# Patient Record
Sex: Male | Born: 1945 | Race: White | Hispanic: No | Marital: Married | Smoking: Never smoker
Health system: Southern US, Community
[De-identification: ages and names within clinical notes are randomized; demographics above are authoritative.]

## PROBLEM LIST (undated history)

## (undated) DIAGNOSIS — I10 Essential (primary) hypertension: Secondary | ICD-10-CM

## (undated) DIAGNOSIS — K760 Fatty (change of) liver, not elsewhere classified: Secondary | ICD-10-CM

## (undated) DIAGNOSIS — K225 Diverticulum of esophagus, acquired: Secondary | ICD-10-CM

## (undated) DIAGNOSIS — K709 Alcoholic liver disease, unspecified: Secondary | ICD-10-CM

## (undated) DIAGNOSIS — T7840XA Allergy, unspecified, initial encounter: Secondary | ICD-10-CM

## (undated) DIAGNOSIS — I7121 Aneurysm of the ascending aorta, without rupture: Secondary | ICD-10-CM

## (undated) DIAGNOSIS — R053 Chronic cough: Secondary | ICD-10-CM

## (undated) DIAGNOSIS — E785 Hyperlipidemia, unspecified: Secondary | ICD-10-CM

## (undated) DIAGNOSIS — R410 Disorientation, unspecified: Secondary | ICD-10-CM

## (undated) DIAGNOSIS — M1990 Unspecified osteoarthritis, unspecified site: Secondary | ICD-10-CM

## (undated) DIAGNOSIS — I712 Thoracic aortic aneurysm, without rupture: Secondary | ICD-10-CM

## (undated) DIAGNOSIS — K7030 Alcoholic cirrhosis of liver without ascites: Secondary | ICD-10-CM

## (undated) HISTORY — PX: POLYPECTOMY: SHX149

## (undated) HISTORY — PX: HERNIA REPAIR: SHX51

## (undated) HISTORY — DX: Chronic cough: R05.3

## (undated) HISTORY — PX: COLONOSCOPY: SHX174

## (undated) HISTORY — DX: Thoracic aortic aneurysm, without rupture: I71.2

## (undated) HISTORY — DX: Fatty (change of) liver, not elsewhere classified: K76.0

## (undated) HISTORY — DX: Unspecified osteoarthritis, unspecified site: M19.90

## (undated) HISTORY — DX: Alcoholic liver disease, unspecified: K70.9

## (undated) HISTORY — DX: Hyperlipidemia, unspecified: E78.5

## (undated) HISTORY — DX: Essential (primary) hypertension: I10

## (undated) HISTORY — DX: Diverticulum of esophagus, acquired: K22.5

## (undated) HISTORY — DX: Allergy, unspecified, initial encounter: T78.40XA

## (undated) HISTORY — PX: TONSILLECTOMY: SUR1361

## (undated) HISTORY — DX: Alcoholic cirrhosis of liver without ascites: K70.30

## (undated) HISTORY — DX: Disorientation, unspecified: R41.0

## (undated) HISTORY — DX: Aneurysm of the ascending aorta, without rupture: I71.21

---

## 1988-05-19 HISTORY — PX: HERNIA REPAIR: SHX51

## 2006-12-30 DIAGNOSIS — I10 Essential (primary) hypertension: Secondary | ICD-10-CM | POA: Insufficient documentation

## 2014-03-20 DIAGNOSIS — E785 Hyperlipidemia, unspecified: Secondary | ICD-10-CM | POA: Insufficient documentation

## 2016-04-23 ENCOUNTER — Telehealth: Payer: Self-pay | Admitting: Internal Medicine

## 2016-04-28 ENCOUNTER — Encounter: Payer: Self-pay | Admitting: Internal Medicine

## 2016-06-02 NOTE — Telephone Encounter (Signed)
Done

## 2016-06-12 ENCOUNTER — Ambulatory Visit (AMBULATORY_SURGERY_CENTER): Payer: Self-pay

## 2016-06-12 VITALS — Ht 72.0 in | Wt 175.0 lb

## 2016-06-12 DIAGNOSIS — Z860100 Personal history of colon polyps, unspecified: Secondary | ICD-10-CM

## 2016-06-12 NOTE — Progress Notes (Signed)
No allergies to eggs or soy No past problems with anesthesia No diet meds No home oxygen  Declined emmi 

## 2016-06-13 ENCOUNTER — Encounter: Payer: Self-pay | Admitting: Internal Medicine

## 2016-06-27 ENCOUNTER — Ambulatory Visit (AMBULATORY_SURGERY_CENTER): Payer: Medicare PPO | Admitting: Internal Medicine

## 2016-06-27 ENCOUNTER — Encounter: Payer: Self-pay | Admitting: Internal Medicine

## 2016-06-27 VITALS — BP 99/69 | HR 63 | Temp 97.8°F | Resp 9 | Ht 72.0 in | Wt 175.0 lb

## 2016-06-27 DIAGNOSIS — D120 Benign neoplasm of cecum: Secondary | ICD-10-CM

## 2016-06-27 DIAGNOSIS — Z860100 Personal history of colon polyps, unspecified: Secondary | ICD-10-CM

## 2016-06-27 DIAGNOSIS — D123 Benign neoplasm of transverse colon: Secondary | ICD-10-CM

## 2016-06-27 HISTORY — DX: Personal history of colonic polyps: Z86.010

## 2016-06-27 MED ORDER — SODIUM CHLORIDE 0.9 % IV SOLN: 500.0000 mL | INTRAVENOUS | Status: DC

## 2016-06-27 NOTE — Patient Instructions (Addendum)
   I found and removed 7 tiny polyps.  I will let you know pathology results and when to have another routine colonoscopy by mail.  I appreciate the opportunity to care for you. Gatha Mayer, MD, FACG   YOU HAD AN ENDOSCOPIC PROCEDURE TODAY AT [facility]:   Refer to the procedure report that was given to you for any specific questions about what was found during the examination.  If the procedure report does not answer your questions, please call your gastroenterologist to clarify.  If you requested that your care partner not be given the details of your procedure findings, then the procedure report has been included in a sealed envelope for you to review at your convenience later.  YOU SHOULD EXPECT: Some feelings of bloating in the abdomen. Passage of more gas than usual.  Walking can help get rid of the air that was put into your GI tract during the procedure and reduce the bloating. If you had a lower endoscopy (such as a colonoscopy or flexible sigmoidoscopy) you may notice spotting of blood in your stool or on the toilet paper. If you underwent a bowel prep for your procedure, you may not have a normal bowel movement for a few days.  Please Note:  You might notice some irritation and congestion in your nose or some drainage.  This is from the oxygen used during your procedure.  There is no need for concern and it should clear up in a day or so.  SYMPTOMS TO REPORT IMMEDIATELY:   Following lower endoscopy (colonoscopy or flexible sigmoidoscopy):  Excessive amounts of blood in the stool  Significant tenderness or worsening of abdominal pains  Swelling of the abdomen that is new, acute  Fever of 100F or higher    For urgent or emergent issues, a gastroenterologist can be reached at any hour by calling [phone].   DIET:  We do recommend a small meal at first, but then you may proceed to your regular diet.  Drink plenty of fluids but you should avoid  alcoholic beverages for 24 hours.  ACTIVITY:  You should plan to take it easy for the rest of today and you should NOT DRIVE or use heavy machinery until tomorrow (because of the sedation medicines used during the test).    FOLLOW UP: Our staff will call the number listed on your records the next business day following your procedure to check on you and address any questions or concerns that you may have regarding the information given to you following your procedure. If we do not reach you, we will leave a message.  However, if you are feeling well and you are not experiencing any problems, there is no need to return our call.  We will assume that you have returned to your regular daily activities without incident.  If any biopsies were taken you will be contacted by phone or by letter within the next 1-3 weeks.  Please call us at [phone] if you have not heard about the biopsies in 3 weeks.    SIGNATURES/CONFIDENTIALITY: You and/or your care partner have signed paperwork which will be entered into your electronic medical record.  These signatures attest to the fact that that the information above on your After Visit Summary has been reviewed and is understood.  Full responsibility of the confidentiality of this discharge information lies with you and/or your care-partner.   INFORMATION ON POLYPS GIVEN TO YOU TODAY

## 2016-06-27 NOTE — Progress Notes (Signed)
Called to room to assist during endoscopic procedure.  Patient ID and intended procedure confirmed with present staff. Received instructions for my participation in the procedure from the performing physician.  

## 2016-06-27 NOTE — Progress Notes (Signed)
Report to PACU, RN, vss, BBS= Clear.  

## 2016-06-27 NOTE — Op Note (Addendum)
[facility] Patient Name: Ryan Moreno Procedure Date: 06/27/2016 8:05 AM MRN: [id number] Endoscopist: Gatha Mayer , MD Age: 71 Referring MD:  Date of Birth: Jul 19, 1945 Gender: Male Account #: [id number] Procedure:                Colonoscopy Indications:              Surveillance: Personal history of adenomatous                            polyps on last colonoscopy 5 years ago Medicines:                Propofol per Anesthesia, Monitored Anesthesia Care Procedure:                Pre-Anesthesia Assessment:                           - Prior to the procedure, a History and Physical                            was performed, and patient medications and                            allergies were reviewed. The patient's tolerance of                            previous anesthesia was also reviewed. The risks                            and benefits of the procedure and the sedation                            options and risks were discussed with the patient.                            All questions were answered, and informed consent                            was obtained. Prior Anticoagulants: The patient                            last took previous NSAID medication 1 day prior to                            the procedure. ASA Grade Assessment: II - A patient                            with mild systemic disease. After reviewing the                            risks and benefits, the patient was deemed in                            satisfactory condition to undergo the procedure.  After obtaining informed consent, the colonoscope                            was passed under direct vision. Throughout the                            procedure, the patient's blood pressure, pulse, and                            oxygen saturations were monitored continuously. The                            Model CF-HQ190L [id number]) scope was introduced      through the anus and advanced to the the cecum,                            identified by appendiceal orifice and ileocecal                            valve. The colonoscopy was performed without                            difficulty. The patient tolerated the procedure                            well. The quality of the bowel preparation was                            good. The bowel preparation used was Miralax. The                            ileocecal valve, appendiceal orifice, and rectum                            were photographed. Scope In: 8:10:22 AM Scope Out: 8:30:29 AM Scope Withdrawal Time: 0 hours 16 minutes 5 seconds  Total Procedure Duration: 0 hours 20 minutes 7 seconds  Findings:                 The perianal and digital rectal examinations were                            normal. Pertinent negatives include normal prostate                            (size, shape, and consistency).                           Seven sessile polyps were found in the transverse                            colon and cecum. The polyps were diminutive in                            size. These polyps  were removed with a cold snare.                            Resection and retrieval were complete. Verification                            of patient identification for the specimen was                            done. Estimated blood loss was minimal.                           The exam was otherwise without abnormality on                            direct and retroflexion views. Complications:            No immediate complications. Estimated Blood Loss:     Estimated blood loss was minimal. Impression:               - Seven diminutive polyps in the transverse colon                            and in the cecum, removed with a cold snare.                            Resected and retrieved.                           - The examination was otherwise normal on direct                            and  retroflexion views. Recommendation:           - Patient has a contact number available for                            emergencies. The signs and symptoms of potential                            delayed complications were discussed with the                            patient. Return to normal activities tomorrow.                            Written discharge instructions were provided to the                            patient.                           - Resume previous diet.                           - Continue present medications.                           -  Repeat colonoscopy is recommended for                            surveillance. The colonoscopy date will be                            determined after pathology results from today's                            exam become available for review. Gatha Mayer, MD 06/27/2016 8:40:25 AM This report has been signed electronically.

## 2016-06-30 ENCOUNTER — Telehealth: Payer: Self-pay | Admitting: *Deleted

## 2016-06-30 ENCOUNTER — Telehealth: Payer: Self-pay

## 2016-06-30 NOTE — Telephone Encounter (Signed)
  Follow up Call-  Call back number 06/27/2016  Post procedure Call Back phone  # [phone]  Permission to leave phone message Yes     Patient questions:  Do you have a fever, pain , or abdominal swelling? No. Pain Score  0 *  Have you tolerated food without any problems? Yes.    Have you been able to return to your normal activities? Yes.    Do you have any questions about your discharge instructions: Diet   No. Medications  No. Follow up visit  No.  Do you have questions or concerns about your Care? No.  Actions: * If pain score is 4 or above: No action needed, pain <4.

## 2016-07-02 ENCOUNTER — Encounter: Payer: Self-pay | Admitting: Internal Medicine

## 2016-07-02 DIAGNOSIS — Z86010 Personal history of colonic polyps: Secondary | ICD-10-CM

## 2016-07-02 NOTE — Progress Notes (Signed)
7 diminutive adenomas Recall 2021

## 2016-09-18 NOTE — Telephone Encounter (Signed)
  Follow up Call-  Call back number 06/27/2016  Post procedure Call Back phone  # [phone]  Permission to leave phone message Yes  Some recent data might be hidden     Patient questions:  Do you have a fever, pain , or abdominal swelling? No. Pain Score  0 *  Have you tolerated food without any problems? Yes.    Have you been able to return to your normal activities? Yes.    Do you have any questions about your discharge instructions: Diet   No. Medications  No. Follow up visit  No.  Do you have questions or concerns about your Care? No.  Actions: * If pain score is 4 or above: No action needed, pain <4.

## 2019-07-20 DIAGNOSIS — I10 Essential (primary) hypertension: Secondary | ICD-10-CM | POA: Diagnosis not present

## 2019-08-24 ENCOUNTER — Telehealth: Payer: Self-pay | Admitting: Internal Medicine

## 2019-08-24 NOTE — Telephone Encounter (Signed)
I reviewed the letter from 2018.  He has agreed to schedule the colon and pre-visit.

## 2019-08-24 NOTE — Telephone Encounter (Signed)
Patient called states he received a recall letter that he's due 06/2019 however he is saying that is incorrect. Please confirm when the patient is due for his colonoscopy

## 2019-09-05 ENCOUNTER — Ambulatory Visit (AMBULATORY_SURGERY_CENTER): Payer: Self-pay | Admitting: *Deleted

## 2019-09-05 ENCOUNTER — Other Ambulatory Visit: Payer: Self-pay

## 2019-09-05 VITALS — Temp 97.8°F | Ht 71.0 in | Wt 188.0 lb

## 2019-09-05 DIAGNOSIS — Z86010 Personal history of colonic polyps: Secondary | ICD-10-CM

## 2019-09-05 NOTE — Progress Notes (Signed)
Patient is here in-person for PV. Patient denies any allergies to eggs or soy. Patient denies any problems with anesthesia/sedation. Patient denies any oxygen use at home. Patient denies taking any diet/weight loss medications or blood thinners. Patient is not being treated for MRSA or C-diff. Patient is aware of our care-partner policy and 0000000 safety protocol. Covid vaccines completed on 08/17/2019.

## 2019-10-05 ENCOUNTER — Encounter: Payer: Self-pay | Admitting: Internal Medicine

## 2019-10-05 ENCOUNTER — Other Ambulatory Visit: Payer: Self-pay

## 2019-10-05 ENCOUNTER — Ambulatory Visit (AMBULATORY_SURGERY_CENTER): Payer: Medicare Other | Admitting: Internal Medicine

## 2019-10-05 VITALS — BP 125/77 | HR 65 | Temp 96.8°F | Resp 12 | Ht 71.0 in | Wt 188.0 lb

## 2019-10-05 DIAGNOSIS — Z86010 Personal history of colonic polyps: Secondary | ICD-10-CM | POA: Diagnosis not present

## 2019-10-05 DIAGNOSIS — D125 Benign neoplasm of sigmoid colon: Secondary | ICD-10-CM | POA: Diagnosis not present

## 2019-10-05 DIAGNOSIS — D124 Benign neoplasm of descending colon: Secondary | ICD-10-CM

## 2019-10-05 MED ORDER — SODIUM CHLORIDE 0.9 % IV SOLN: 500.0000 mL | INTRAVENOUS | Status: DC

## 2019-10-05 NOTE — Progress Notes (Signed)
Temp JB V/s CW I have reviewed the patient's medical history in detail and updated the computerized patient record. 

## 2019-10-05 NOTE — Progress Notes (Signed)
A/ox3, pleased with MAC, report to RN 

## 2019-10-05 NOTE — Progress Notes (Signed)
Called to room to assist during endoscopic procedure.  Patient ID and intended procedure confirmed with present staff. Received instructions for my participation in the procedure from the performing physician.  

## 2019-10-05 NOTE — Patient Instructions (Addendum)
Only two tiny polyps today.  I am not going to recommend a routine repeat exam.  I appreciate the opportunity to care for you. Gatha Mayer, MD, FACG YOU HAD AN ENDOSCOPIC PROCEDURE TODAY AT [facility]:   Refer to the procedure report that was given to you for any specific questions about what was found during the examination.  If the procedure report does not answer your questions, please call your gastroenterologist to clarify.  If you requested that your care partner not be given the details of your procedure findings, then the procedure report has been included in a sealed envelope for you to review at your convenience later.  YOU SHOULD EXPECT: Some feelings of bloating in the abdomen. Passage of more gas than usual.  Walking can help get rid of the air that was put into your GI tract during the procedure and reduce the bloating. If you had a lower endoscopy (such as a colonoscopy or flexible sigmoidoscopy) you may notice spotting of blood in your stool or on the toilet paper. If you underwent a bowel prep for your procedure, you may not have a normal bowel movement for a few days.  Please Note:  You might notice some irritation and congestion in your nose or some drainage.  This is from the oxygen used during your procedure.  There is no need for concern and it should clear up in a day or so.  SYMPTOMS TO REPORT IMMEDIATELY:   Following lower endoscopy (colonoscopy or flexible sigmoidoscopy):  Excessive amounts of blood in the stool  Significant tenderness or worsening of abdominal pains  Swelling of the abdomen that is new, acute  Fever of 100F or higher   For urgent or emergent issues, a gastroenterologist can be reached at any hour by calling [phone]. Do not use MyChart messaging for urgent concerns.    DIET:  We do recommend a small meal at first, but then you may proceed to your regular diet.  Drink plenty of fluids but you should avoid alcoholic  beverages for 24 hours.  MEDICATIONS: Continue present medications.  Please see handouts given to you by your recovery nurse.  ACTIVITY:  You should plan to take it easy for the rest of today and you should NOT DRIVE or use heavy machinery until tomorrow (because of the sedation medicines used during the test).    FOLLOW UP: Our staff will call the number listed on your records 48-72 hours following your procedure to check on you and address any questions or concerns that you may have regarding the information given to you following your procedure. If we do not reach you, we will leave a message.  We will attempt to reach you two times.  During this call, we will ask if you have developed any symptoms of COVID 19. If you develop any symptoms (ie: fever, flu-like symptoms, shortness of breath, cough etc.) before then, please call [phone].  If you test positive for Covid 19 in the 2 weeks post procedure, please call and report this information to [place].    If any biopsies were taken you will be contacted by phone or by letter within the next 1-3 weeks.  Please call us at [phone] if you have not heard about the biopsies in 3 weeks.   Thank you for allowing [place] to provide for your healthcare needs today.  SIGNATURES/CONFIDENTIALITY: You and/or your care partner have signed paperwork which will be entered into your electronic medical  record.  These signatures attest to the fact that that the information above on your After Visit Summary has been reviewed and is understood.  Full responsibility of the confidentiality of this discharge information lies with you and/or your care-partner. 

## 2019-10-05 NOTE — Op Note (Signed)
[facility] Patient Name: Ryan Moreno Procedure Date: 10/05/2019 10:25 AM MRN: [id number] Endoscopist: Gatha Mayer , MD Age: 74 Referring MD:  Date of Birth: 11-14-1945 Gender: Male Account #: [id number] Procedure:                Colonoscopy Indications:              Surveillance: Personal history of adenomatous                            polyps on last colonoscopy 3 years ago Medicines:                Propofol per Anesthesia, Monitored Anesthesia Care Procedure:                Pre-Anesthesia Assessment:                           - Prior to the procedure, a History and Physical                            was performed, and patient medications and                            allergies were reviewed. The patient's tolerance of                            previous anesthesia was also reviewed. The risks                            and benefits of the procedure and the sedation                            options and risks were discussed with the patient.                            All questions were answered, and informed consent                            was obtained. Prior Anticoagulants: The patient has                            taken no previous anticoagulant or antiplatelet                            agents. ASA Grade Assessment: II - A patient with                            mild systemic disease. After reviewing the risks                            and benefits, the patient was deemed in                            satisfactory condition to undergo the procedure.  After obtaining informed consent, the colonoscope                            was passed under direct vision. Throughout the                            procedure, the patient's blood pressure, pulse, and                            oxygen saturations were monitored continuously. The                            Colonoscope was introduced through the anus and                             advanced to the the cecum, identified by                            appendiceal orifice and ileocecal valve. The                            colonoscopy was performed without difficulty. The                            patient tolerated the procedure well. The quality                            of the bowel preparation was good. The bowel                            preparation used was Miralax via split dose                            instruction. Anatomical landmarks were photographed. Scope In: 10:34:11 AM Scope Out: 10:52:58 AM Scope Withdrawal Time: 0 hours 14 minutes 2 seconds  Total Procedure Duration: 0 hours 18 minutes 47 seconds  Findings:                 The perianal and digital rectal examinations were                            normal. Pertinent negatives include normal prostate                            (size, shape, and consistency).                           Two sessile polyps were found in the sigmoid colon                            and descending colon. The polyps were 1 to 2 mm in                            size. These polyps were removed with a  cold biopsy                            forceps. Resection and retrieval were complete.                            Verification of patient identification for the                            specimen was done. Estimated blood loss was minimal.                           Scattered small-mouthed diverticula were found in                            the sigmoid colon and descending colon.                           The exam was otherwise without abnormality on                            direct and retroflexion views. Complications:            No immediate complications. Estimated Blood Loss:     Estimated blood loss was minimal. Impression:               - Two 1 to 2 mm polyps in the sigmoid colon and in                            the descending colon, removed with a cold biopsy                            forceps. Resected and  retrieved.                           - Diverticulosis in the sigmoid colon and in the                            descending colon.                           - The examination was otherwise normal on direct                            and retroflexion views.                           - Personal history of colonic polyps. Adenomas                            2004, 2012, 2018 (7 diminutive) Recommendation:           - Patient has a contact number available for                            emergencies. The signs and symptoms of potential  delayed complications were discussed with the                            patient. Return to normal activities tomorrow.                            Written discharge instructions were provided to the                            patient.                           - Resume previous diet.                           - Continue present medications.                           - No repeat colonoscopy due to age. Gatha Mayer, MD 10/05/2019 10:58:52 AM This report has been signed electronically.

## 2019-10-07 ENCOUNTER — Telehealth: Payer: Self-pay | Admitting: *Deleted

## 2019-10-07 NOTE — Telephone Encounter (Signed)
  Follow up Call-  Call back number 10/05/2019  Post procedure Call Back phone  # [phone]  Permission to leave phone message Yes  Some recent data might be hidden     Patient questions:  Do you have a fever, pain , or abdominal swelling? No. Pain Score  0 *  Have you tolerated food without any problems? Yes.    Have you been able to return to your normal activities? Yes.    Do you have any questions about your discharge instructions: Diet   No. Medications  No. Follow up visit  No.  Do you have questions or concerns about your Care? No.  Actions: * If pain score is 4 or above: No action needed, pain <4.  1. Have you developed a fever since your procedure? no  2.   Have you had an respiratory symptoms (SOB or cough) since your procedure? no  3.   Have you tested positive for COVID 19 since your procedure no  4.   Have you had any family members/close contacts diagnosed with the COVID 19 since your procedure?  no   If yes to any of these questions please route to Joylene John, RN and Erenest Rasher, RN

## 2019-10-13 ENCOUNTER — Encounter: Payer: Self-pay | Admitting: Internal Medicine

## 2019-11-04 DIAGNOSIS — H2513 Age-related nuclear cataract, bilateral: Secondary | ICD-10-CM | POA: Diagnosis not present

## 2020-04-24 DIAGNOSIS — I10 Essential (primary) hypertension: Secondary | ICD-10-CM | POA: Diagnosis not present

## 2020-04-24 DIAGNOSIS — Z0000 Encounter for general adult medical examination without abnormal findings: Secondary | ICD-10-CM | POA: Diagnosis not present

## 2020-05-01 DIAGNOSIS — H6121 Impacted cerumen, right ear: Secondary | ICD-10-CM | POA: Diagnosis not present

## 2020-05-01 DIAGNOSIS — R0982 Postnasal drip: Secondary | ICD-10-CM | POA: Diagnosis not present

## 2020-05-01 DIAGNOSIS — J3489 Other specified disorders of nose and nasal sinuses: Secondary | ICD-10-CM | POA: Diagnosis not present

## 2020-06-11 DIAGNOSIS — I10 Essential (primary) hypertension: Secondary | ICD-10-CM | POA: Diagnosis not present

## 2020-06-11 DIAGNOSIS — R053 Chronic cough: Secondary | ICD-10-CM | POA: Diagnosis not present

## 2020-06-11 DIAGNOSIS — M20001 Unspecified deformity of right finger(s): Secondary | ICD-10-CM | POA: Diagnosis not present

## 2020-06-19 ENCOUNTER — Ambulatory Visit: Payer: Medicare Other | Admitting: Pulmonary Disease

## 2020-06-19 ENCOUNTER — Other Ambulatory Visit: Payer: Self-pay

## 2020-06-19 ENCOUNTER — Encounter: Payer: Self-pay | Admitting: Pulmonary Disease

## 2020-06-19 VITALS — BP 144/84 | HR 62 | Temp 97.4°F | Ht 71.0 in | Wt 182.0 lb

## 2020-06-19 DIAGNOSIS — J410 Simple chronic bronchitis: Secondary | ICD-10-CM | POA: Diagnosis not present

## 2020-06-19 DIAGNOSIS — J479 Bronchiectasis, uncomplicated: Secondary | ICD-10-CM

## 2020-06-19 NOTE — Patient Instructions (Signed)
  High-resolution CT of the chest to check for the condition we discussed: Bronchiectasis We discussed saline nebulizer treatment but will hold off for now unless symptoms get worse  Call us if you get a bad episode of bronchitis -yellow-green sputum

## 2020-06-19 NOTE — Assessment & Plan Note (Signed)
He seems to have chronic bronchitis for 5 years, never smoker.  Detailed environmental history does not reveal any ongoing trigger.  No evidence of overt GERD or postnasal drip, symptoms seems to be perennial.  Prior treatments have not helped.  He is not into medications on a chronic basis.  We discussed hypertonic saline nebs should this get worse. Given right lower lobe crackles I would like to investigate for bronchiectasis with a high-resolution CT of the chest.  If this is negative then we would take a wait and watch approach.  If he has significant bronchiectasis then we will consider sputum culture and further work-up

## 2020-06-19 NOTE — Progress Notes (Signed)
Subjective:    Patient ID: Ryan Moreno, male    DOB: [birth year], 75 y.o.   MRN: [id number]  HPI   Chief Complaint  Patient presents with  . Consult    Chronic bronchitis, chronic cough with lots of mucus x 5 years.     75 year old retired never smoker presents for evaluation of chronic cough with mucus production.  This has been ongoing for 5 years, symptoms are perennial and he denies any seasonal flares.  Once every 5 years he has a bad case of bronchitis that requires medications, last such episode was in Thanksgiving.  He reports occasional heartburn.  He denies any dyspnea is able to walk 3 miles daily.  No wheezing. He has seen ENT x2 -was prescribed allergy medications, Mucinex, sinus rinses and GERD medication/omeprazole with limited benefit. Cough does not have seasonal or diurnal variation.  This time seems to be more of a nuisance. He denies any history of childhood pneumonias. No weight loss or fevers. 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  He lives in a townhome with his wife, has carpets, poodle for 3 years   Past Medical History:  Diagnosis Date  . Allergy   . Arthritis   . Hyperlipidemia   . Hypertension    Past Surgical History:  Procedure Laterality Date  . COLONOSCOPY  2012, 06/27/2016  . HERNIA REPAIR    . POLYPECTOMY      Allergies  Allergen Reactions  . Lisinopril Other (See Comments)    "Brain fog"  . Compazine [Prochlorperazine Edisylate]     Swelling of throat    Social History   Socioeconomic History  . Marital status: Married    Spouse name: Not on file  . Number of children: Not on file  . Years of education: Not on file  . Highest education level: Not on file  Occupational History  . Not on file  Tobacco Use  . Smoking status: Never Smoker  . Smokeless tobacco: Never Used  Vaping Use  . Vaping Use: Never used  Substance and Sexual Activity  . Alcohol  use: Yes    Alcohol/week: 5.0 standard drinks    Types: 5 Standard drinks or equivalent per week  . Drug use: No  . Sexual activity: Not on file  Other Topics Concern  . Not on file  Social History Narrative  . Not on file   Social Determinants of Health   Financial Resource Strain: Not on file  Food Insecurity: Not on file  Transportation Needs: Not on file  Physical Activity: Not on file  Stress: Not on file  Social Connections: Not on file  Intimate Partner Violence: Not on file     Family History  Problem Relation Age of Onset  . Esophageal cancer Neg Hx   . Rectal cancer Neg Hx   . Stomach cancer Neg Hx   . Colon polyps Neg Hx   . Colon cancer Neg Hx        Review of Systems Productive cough Indigestion Difficulty swallowing       Objective:   Physical Exam   Gen. Pleasant, well-nourished, in no distress, normal affect ENT - no pallor,icterus, no post nasal drip Neck: No JVD, no thyromegaly, no carotid bruits Lungs: no use of accessory muscles, no dullness to percussion, RT basal dry rales or rhonchi  Cardiovascular: Rhythm regular, heart sounds  normal, no murmurs or gallops, no peripheral edema  Abdomen: soft and non-tender, no hepatosplenomegaly, BS normal. Musculoskeletal: No deformities, no cyanosis or clubbing Neuro:  alert, non focal        Assessment & Plan:

## 2020-07-09 ENCOUNTER — Other Ambulatory Visit: Payer: Self-pay

## 2020-07-09 ENCOUNTER — Ambulatory Visit (INDEPENDENT_AMBULATORY_CARE_PROVIDER_SITE_OTHER)
Admission: RE | Admit: 2020-07-09 | Discharge: 2020-07-09 | Disposition: A | Discharge status: Home / Self Care | Payer: Medicare Other | Source: Ambulatory Visit | Attending: Pulmonary Disease | Admitting: Pulmonary Disease

## 2020-07-09 DIAGNOSIS — I712 Thoracic aortic aneurysm, without rupture: Secondary | ICD-10-CM | POA: Diagnosis not present

## 2020-07-09 DIAGNOSIS — J479 Bronchiectasis, uncomplicated: Secondary | ICD-10-CM

## 2020-07-09 DIAGNOSIS — I2510 Atherosclerotic heart disease of native coronary artery without angina pectoris: Secondary | ICD-10-CM | POA: Diagnosis not present

## 2020-07-09 DIAGNOSIS — J984 Other disorders of lung: Secondary | ICD-10-CM | POA: Diagnosis not present

## 2020-07-23 DIAGNOSIS — D485 Neoplasm of uncertain behavior of skin: Secondary | ICD-10-CM | POA: Diagnosis not present

## 2020-07-23 DIAGNOSIS — L570 Actinic keratosis: Secondary | ICD-10-CM | POA: Diagnosis not present

## 2020-07-23 DIAGNOSIS — C44519 Basal cell carcinoma of skin of other part of trunk: Secondary | ICD-10-CM | POA: Diagnosis not present

## 2020-07-23 DIAGNOSIS — L819 Disorder of pigmentation, unspecified: Secondary | ICD-10-CM | POA: Diagnosis not present

## 2020-07-23 DIAGNOSIS — L821 Other seborrheic keratosis: Secondary | ICD-10-CM | POA: Diagnosis not present

## 2020-07-23 DIAGNOSIS — L578 Other skin changes due to chronic exposure to nonionizing radiation: Secondary | ICD-10-CM | POA: Diagnosis not present

## 2020-07-30 DIAGNOSIS — L820 Inflamed seborrheic keratosis: Secondary | ICD-10-CM | POA: Diagnosis not present

## 2020-07-30 DIAGNOSIS — C44519 Basal cell carcinoma of skin of other part of trunk: Secondary | ICD-10-CM | POA: Diagnosis not present

## 2020-07-30 DIAGNOSIS — L570 Actinic keratosis: Secondary | ICD-10-CM | POA: Diagnosis not present

## 2020-08-09 ENCOUNTER — Encounter: Payer: Self-pay | Admitting: Pulmonary Disease

## 2020-08-09 ENCOUNTER — Other Ambulatory Visit: Payer: Self-pay

## 2020-08-09 ENCOUNTER — Ambulatory Visit: Payer: Medicare Other | Admitting: Pulmonary Disease

## 2020-08-09 DIAGNOSIS — I712 Thoracic aortic aneurysm, without rupture: Secondary | ICD-10-CM | POA: Diagnosis not present

## 2020-08-09 DIAGNOSIS — K7030 Alcoholic cirrhosis of liver without ascites: Secondary | ICD-10-CM | POA: Diagnosis not present

## 2020-08-09 DIAGNOSIS — J479 Bronchiectasis, uncomplicated: Secondary | ICD-10-CM | POA: Diagnosis not present

## 2020-08-09 DIAGNOSIS — I7121 Aneurysm of the ascending aorta, without rupture: Secondary | ICD-10-CM

## 2020-08-09 DIAGNOSIS — R1313 Dysphagia, pharyngeal phase: Secondary | ICD-10-CM | POA: Diagnosis not present

## 2020-08-09 DIAGNOSIS — R1310 Dysphagia, unspecified: Secondary | ICD-10-CM | POA: Insufficient documentation

## 2020-08-09 NOTE — Assessment & Plan Note (Signed)
Explained to him implications of this finding.  Follow-up CT chest in 1 year

## 2020-08-09 NOTE — Assessment & Plan Note (Signed)
Reviewed CT scan which shows mild central bronchiectasis.  This may be the cause of his chronic bronchitis or the effect of his repeated episodes of bronchitis. Etiology remains unclear -we will check for aspiration with esophagram given his swallowing issues. We will schedule follow-up CT in 1 year to see if there is progression and if so we would consider more aggressive work-up for other etiologies such as MAC  We discussed signs and symptoms of an exacerbation and he will call us for increase in sputum quantity or change in color

## 2020-08-09 NOTE — Assessment & Plan Note (Signed)
I explained to him that CT shows cirrhosis, since he has seen Dr. Autumn Patty from GI, will schedule follow-up with them.  I encouraged him to stop drinking

## 2020-08-09 NOTE — Assessment & Plan Note (Signed)
He reports food getting stuck in the throat area with regurgitation afterwards suggesting a diverticulum.  We will schedule esophagram to clarify.  This can also be followed up on his GI appointment

## 2020-08-09 NOTE — Patient Instructions (Signed)
We discussed findings of cirrhosis in the liver and aortic aneurysm  HRCT chest in 1 year Referral to dr Carlean Purl from GI

## 2020-08-09 NOTE — Progress Notes (Signed)
   Subjective:    Patient ID: Ryan Moreno, male    DOB: 30-Dec-1945, 75 y.o.   MRN: [id number]  HPI  75 year old retired never smoker for FU of chronic bronchitis since 2017. Last episode of acute bronchitis 03/2020  He continues to complain of cough with clear sputum, no dyspnea No interval flareups. We discussed CT findings of bronchiectasis, cirrhosis of the liver and aneurysm with ascending aorta. Also reports food getting stuck sometimes when he swallows and afterwards when he coughs he has some regurgitation of food contents.  He has seen ENT in the past who has told him that he may have diverticulum  He admits to drinking a few beers Moreno day and understands that this is a problem  Significant tests/ events reviewed  06/2020 HRCT chest >>Mild central bronchiectasis.  Liver appears cirrhotic. 4.1 cm ascending aorta  Past Medical History:  Diagnosis Date  . Allergy   . Arthritis   . Hyperlipidemia   . Hypertension      Review of Systems neg for any significant sore throat, dysphagia, itching, sneezing, nasal congestion or excess/ purulent secretions, fever, chills, sweats, unintended wt loss, pleuritic or exertional cp, hempoptysis, orthopnea pnd or change in chronic leg swelling. Also denies presyncope, palpitations, heartburn, abdominal pain, nausea, vomiting, diarrhea or change in bowel or urinary habits, dysuria,hematuria, rash, arthralgias, visual complaints, headache, numbness weakness or ataxia.     Objective:   Physical Exam  Gen. Pleasant, well-nourished, in no distress ENT - no thrush, no pallor/icterus,no post nasal drip Neck: No JVD, no thyromegaly, no carotid bruits Lungs: no use of accessory muscles, no dullness to percussion, clear without rales or rhonchi  Cardiovascular: Rhythm regular, heart sounds  normal, no murmurs or gallops, no peripheral edema Musculoskeletal: No deformities, no cyanosis or clubbing        Assessment & Plan:

## 2020-08-15 ENCOUNTER — Encounter: Payer: Self-pay | Admitting: Internal Medicine

## 2020-09-25 ENCOUNTER — Other Ambulatory Visit (INDEPENDENT_AMBULATORY_CARE_PROVIDER_SITE_OTHER): Payer: Medicare Other

## 2020-09-25 ENCOUNTER — Encounter: Payer: Self-pay | Admitting: Internal Medicine

## 2020-09-25 ENCOUNTER — Ambulatory Visit: Payer: Medicare Other | Admitting: Internal Medicine

## 2020-09-25 ENCOUNTER — Other Ambulatory Visit (HOSPITAL_COMMUNITY): Payer: Self-pay | Admitting: *Deleted

## 2020-09-25 VITALS — BP 134/72 | HR 60 | Ht 69.75 in | Wt 187.4 lb

## 2020-09-25 DIAGNOSIS — R1013 Epigastric pain: Secondary | ICD-10-CM

## 2020-09-25 DIAGNOSIS — R4189 Other symptoms and signs involving cognitive functions and awareness: Secondary | ICD-10-CM

## 2020-09-25 DIAGNOSIS — R1314 Dysphagia, pharyngoesophageal phase: Secondary | ICD-10-CM

## 2020-09-25 DIAGNOSIS — R932 Abnormal findings on diagnostic imaging of liver and biliary tract: Secondary | ICD-10-CM

## 2020-09-25 DIAGNOSIS — F1010 Alcohol abuse, uncomplicated: Secondary | ICD-10-CM | POA: Diagnosis not present

## 2020-09-25 DIAGNOSIS — R053 Chronic cough: Secondary | ICD-10-CM

## 2020-09-25 DIAGNOSIS — R1310 Dysphagia, unspecified: Secondary | ICD-10-CM

## 2020-09-25 LAB — PROTIME-INR
INR: 0.9 ratio (ref 0.8–1.0)
Prothrombin Time: 10.5 s (ref 9.6–13.1)

## 2020-09-25 LAB — COMPREHENSIVE METABOLIC PANEL
ALT: 12 U/L (ref 0–53)
AST: 13 U/L (ref 0–37)
Albumin: 4.6 g/dL (ref 3.5–5.2)
Alkaline Phosphatase: 70 U/L (ref 39–117)
BUN: 22 mg/dL (ref 6–23)
CO2: 26 mEq/L (ref 19–32)
Calcium: 9.5 mg/dL (ref 8.4–10.5)
Chloride: 105 mEq/L (ref 96–112)
Creatinine, Ser: 1.06 mg/dL (ref 0.40–1.50)
GFR: 67.96 mL/min (ref >60.00)
Glucose, Bld: 107 mg/dL — ABNORMAL HIGH (ref 70–99)
Potassium: 4 mEq/L (ref 3.5–5.1)
Sodium: 140 mEq/L (ref 135–145)
Total Bilirubin: 0.5 mg/dL (ref 0.2–1.2)
Total Protein: 7.6 g/dL (ref 6.0–8.3)

## 2020-09-25 LAB — CBC WITH DIFFERENTIAL/PLATELET
Basophils Absolute: 0.1 10*3/uL (ref 0.0–0.1)
Basophils Relative: 1 % (ref 0.0–3.0)
Eosinophils Absolute: 0.2 10*3/uL (ref 0.0–0.7)
Eosinophils Relative: 3.2 % (ref 0.0–5.0)
HCT: 40.8 % (ref 39.0–52.0)
Hemoglobin: 14.1 g/dL (ref 13.0–17.0)
Lymphocytes Relative: 36.3 % (ref 12.0–46.0)
Lymphs Abs: 1.8 10*3/uL (ref 0.7–4.0)
MCHC: 34.6 g/dL (ref 30.0–36.0)
MCV: 98.7 fl (ref 78.0–100.0)
Monocytes Absolute: 0.7 10*3/uL (ref 0.1–1.0)
Monocytes Relative: 14.4 % — ABNORMAL HIGH (ref 3.0–12.0)
Neutro Abs: 2.2 10*3/uL (ref 1.4–7.7)
Neutrophils Relative %: 45.1 % (ref 43.0–77.0)
Platelets: 207 10*3/uL (ref 150.0–400.0)
RBC: 4.14 Mil/uL — ABNORMAL LOW (ref 4.22–5.81)
RDW: 14.4 % (ref 11.5–15.5)
WBC: 5 10*3/uL (ref 4.0–10.5)

## 2020-09-25 LAB — VITAMIN B12: Vitamin B-12: 872 pg/mL (ref 211–911)

## 2020-09-25 LAB — TSH: TSH: 1.94 u[IU]/mL (ref 0.35–4.50)

## 2020-09-25 LAB — AMMONIA: Ammonia: 28 umol/L (ref 11–35)

## 2020-09-25 NOTE — Progress Notes (Signed)
Ryan Moreno 75 y.o. Sep 16, 1945 [id number]  Assessment & Plan:   Encounter Diagnoses  Name Primary?  Ryan Moreno Dysphagia, pharyngoesophageal phase Yes  . Chronic cough   . Abnormal CT of liver   . Excessive drinking alcohol   . Brain fog   . Dyspepsia     I am not convinced he has cirrhosis.  Sounds like he does have alcoholic liver disease or certainly is at risk for that.  Previous screening for hepatitis C negative.  Normal platelets go against cirrhosis in my opinion.  This could be a radiologic overcall.  He does have a history of excessive alcohol use.  He needs to reduce this at a minimum.  He has good insight into this.  I will have him do the following work-up with labs and ultrasound imaging.  He could have some depressed mood issues and I have encouraged him to follow-up with PCP on this.  Modified barium swallow to evaluate the dysphagia.   Orders Placed This Encounter  Procedures  . US Abdomen Complete  . CBC with Differential/Platelet  . Comprehensive metabolic panel  . Ammonia  . TSH  . Protime-INR  . Vitamin B12  . Hepatitis B surface antibody,qualitative  . Hepatitis A antibody, total  . Hepatitis B core antibody, total  . SLP modified barium swallow   Follow-up to be arranged pending the above.  CC: Ryan Smoker, MD Dr. Arlyn Dunning Subjective:   Chief Complaint: Abnormal CT of the liver raising question of cirrhosis  HPI  Patient is here after he had a CT scan through pulmonary which demonstrated the following.  Images reviewed with the patient.   IMPRESSION: 1. No evidence of interstitial lung disease. Mild central bronchiectasis. Air trapping is indicative of small airways disease. 2. Liver appears mildly cirrhotic. 3. Possible punctate left renal stone. 4. Ascending aortic aneurysm. Recommend annual imaging followup by CTA or MRA. This recommendation follows 2010 ACCF/AHA/AATS/ACR/ASA/SCA/SCAI/SIR/STS/SVM Guidelines for  the Diagnosis and Management of Patients with Thoracic Aortic Disease. Circulation. 2010; 121: Z858-I502. Aortic aneurysm NOS (ICD10-I71.9). 5. Aortic atherosclerosis (ICD10-I70.0). Coronary artery Calcification.  She reports that she has really "too much".  6-7 drinks a day a mixture of beer wine and liquor.  Feels bored and likes that is what he drinks he says.  Lately he has lost his taste for alcohol for some reason his had a metallic taste in his mouth for unclear reasons.  He also has problems with dysphagia and intermittent cough.  He is seeing pulmonary for the cough.  Crackers will seem to catch in the back of the throat.  Does not have [place] impact dysphagia this has been a problem for 5 or 6 years.  I do not get a strong history of heartburn.  2 coffees a day some tea.  No history of EGD.  Colonoscopy screening and surveillance is up-to-date with last colonoscopy in 2021.  History of adenomas in the past he had 1 diminutive adenoma and 1 diminutive hyperplastic polyp on this most recent colonoscopy as well as diverticulosis.  Other complaints are some mild dyspepsia brain fog at times. Thinking of moving [place], [place] Allergies  Allergen Reactions  . Lisinopril Other (See Comments)    "Brain fog"  . Compazine [Prochlorperazine Edisylate]     Swelling of throat   Current Meds  Medication Sig  . allopurinol (ZYLOPRIM) 100 MG tablet Take 50 mg by mouth daily.  Ryan Moreno amLODipine (NORVASC) 2.5 MG tablet Take 5 mg by mouth daily.  Ryan Moreno  indomethacin (INDOCIN) 25 MG capsule Take 1 capsule by mouth 3 (three) times daily with meals.  . sildenafil (REVATIO) 20 MG tablet TAKE 2 TO 3 TABLETS BY MOUTH AS DIRECTED   Past Medical History:  Diagnosis Date  . Allergy   . Arthritis   . Ascending aortic aneurysm ([place])   . Cirrhosis, alcoholic ([place])   . Hx of colonic polyps 06/27/2016   2004 polyps reported 2012 diminutive adenoma 06/27/2016 7 diminutive polyps removed ALL adenomas recall 2021   .  Hyperlipidemia   . Hypertension    Past Surgical History:  Procedure Laterality Date  . COLONOSCOPY  2012, 06/27/2016  . HERNIA REPAIR     Social History   Social History Narrative   Married and retired   Never Moreno 5+ alcoholic beverages daily no drug use      Review of Systems  As per HPI o Objective:   Physical Exam BP 134/72 (BP Location: Left Arm, Patient Position: Sitting, Cuff Size: Normal)   Pulse 60   Ht 5' 9.75" (1.772 m) Comment: height measured without shoes  Wt 187 lb 6 oz (85 kg)   BMI 27.08 kg/m  NAD elderly wm Lungs cta'cor NL s1s2 no rmg abd obese somewhat no HSM.mass BS + Skin no stigmata CLD  Data reviewed include primary care notes, pulmonary notes in the last year labs in the EMR image review as above including review images in person

## 2020-09-25 NOTE — Patient Instructions (Addendum)
Your provider has requested that you go to the basement level for lab work before leaving today. Press "B" on the elevator. The lab is located at the first door on the left as you exit the elevator.  Due to recent changes in healthcare laws, you may see the results of your imaging and laboratory studies on MyChart before your provider has had a chance to review them.  We understand that in some cases there may be results that are confusing or concerning to you. Not all laboratory results come back in the same time frame and the provider may be waiting for multiple results in order to interpret others.  Please give [place] 48 hours in order for your provider to thoroughly review all the results before contacting the office for clarification of your results.   You will be contacted by [facility] in the next 2 days to arrange a complete abdominal ultrasound.  The number on your caller ID will be [phone], please answer when they call.  If you have not heard from them in 2 days please call [phone] to schedule.    You have been scheduled for a modified barium swallow on 10/02/2020 at 11:00am. Please arrive 15 minutes prior to your test for registration. You will go to [facility]iology (1st Floor) for your appointment. Should you need to cancel or reschedule your appointment, please contact [phone] [facility] [place]) or [phone] [place]). _____________________________________________________________________ A Modified Barium Swallow Study, or MBS, is a special x-ray that is taken to check swallowing skills. It is carried out by a Stage manager and a Psychologist, clinical (SLP). During this test, yourmouth, throat, and esophagus, a muscular tube which connects your mouth to your stomach, is checked. The test will help you, your doctor, and the SLP plan what types of foods and liquids are easier for you to swallow. The SLP will also identify positions and ways to help you  swallow more easily and safely. What will happen during an MBS? You will be taken to an x-ray room and seated comfortably. You will be asked to swallow small amounts of food and liquid mixed with barium. Barium is a liquid or paste that allows images of your mouth, throat and esophagus to be seen on x-ray. The x-ray captures moving images of the food you are swallowing as it travels from your mouth through your throat and into your esophagus. This test helps identify whether food or liquid is entering your lungs (aspiration). The test also shows which part of your mouth or throat lacks strength or coordination to move the food or liquid in the right direction. This test typically takes 30 minutes to 1 hour to complete. _______________________________________________________________________  Please abstain from drinking alcohol.  You may take over the counter Pepcid as needed for indigestion.  I appreciate the opportunity to care for you. Ronney Lion, [facility]

## 2020-09-26 LAB — HEPATITIS B SURFACE ANTIBODY,QUALITATIVE: Hep B S Ab: NONREACTIVE

## 2020-09-26 LAB — HEPATITIS A ANTIBODY, TOTAL: Hepatitis A AB,Total: REACTIVE — AB

## 2020-09-26 LAB — HEPATITIS B CORE ANTIBODY, TOTAL: Hep B Core Total Ab: NONREACTIVE

## 2020-10-01 ENCOUNTER — Encounter: Payer: Self-pay | Admitting: Internal Medicine

## 2020-10-02 ENCOUNTER — Ambulatory Visit (HOSPITAL_COMMUNITY)
Admission: RE | Admit: 2020-10-02 | Discharge: 2020-10-02 | Disposition: A | Discharge status: Home / Self Care | Payer: Medicare Other | Source: Ambulatory Visit | Attending: Internal Medicine | Admitting: Internal Medicine

## 2020-10-02 ENCOUNTER — Other Ambulatory Visit: Payer: Self-pay

## 2020-10-02 DIAGNOSIS — R1314 Dysphagia, pharyngoesophageal phase: Secondary | ICD-10-CM | POA: Insufficient documentation

## 2020-10-02 DIAGNOSIS — R1310 Dysphagia, unspecified: Secondary | ICD-10-CM | POA: Diagnosis not present

## 2020-10-02 DIAGNOSIS — R053 Chronic cough: Secondary | ICD-10-CM | POA: Insufficient documentation

## 2020-10-02 DIAGNOSIS — R059 Cough, unspecified: Secondary | ICD-10-CM | POA: Diagnosis not present

## 2020-10-02 NOTE — Progress Notes (Signed)
Modified Barium Swallow Progress Note  Patient Details  Name: Ryan Moreno MRN: [id number] Date of Birth: Feb 07, 1946  Today's Date: 10/02/2020  Modified Barium Swallow completed.  Full report located under Chart Review in the Imaging Section.  Brief recommendations include the following:  Clinical Impression  Pt's oropharyngeal swallowing is WFL, but with post-prandial aspiration noted secondary to presence of a moderately-sized diverticulum. Barium of all consistencies accumulates within this outpouching with consistent backflow into the pharynx. This primarily remains in the pyriform sinuses but is occasionally silently aspirated in trace amounts, especially when pt is consuming larger or more consecutive boluses. Given that this can occur regardless of consistency, and that he denies any recent changes in his breathing or dx of PNA, recommend regular solids and thin liquids via small, single bites and sips. May also want to consider reducing specific foods that he has noticed to give him more subjective difficulties, or try incorporating foods that are more moist. Alternating with sips of thin liquids did NOT clear the diverticulum, as the liquids also left residuals here. Given the frequency with which backflow was occurring and its impact on airway protection, may want to reconsider ENT,   Swallow Evaluation Recommendations   Recommended Consults: Consider ENT evaluation   SLP Diet Recommendations: Regular solids;Thin liquid   Liquid Administration via: Cup;Straw   Medication Administration: Whole meds with liquid   Supervision: Patient able to self feed   Compensations: Slow rate;Small sips/bites;Hard cough after swallow   Postural Changes: Seated upright at 90 degrees;Remain semi-upright after after feeds/meals (Comment)   Oral Care Recommendations: Oral care BID        [name]., M.A. [place] Pager [phone] Office  [phone]  10/02/2020,12:04 PM

## 2020-10-04 ENCOUNTER — Ambulatory Visit (HOSPITAL_COMMUNITY)
Admission: RE | Admit: 2020-10-04 | Discharge: 2020-10-04 | Disposition: A | Discharge status: Home / Self Care | Payer: Medicare Other | Source: Ambulatory Visit | Attending: Internal Medicine | Admitting: Internal Medicine

## 2020-10-04 ENCOUNTER — Other Ambulatory Visit: Payer: Self-pay

## 2020-10-04 DIAGNOSIS — R932 Abnormal findings on diagnostic imaging of liver and biliary tract: Secondary | ICD-10-CM | POA: Insufficient documentation

## 2020-10-04 DIAGNOSIS — Z87442 Personal history of urinary calculi: Secondary | ICD-10-CM | POA: Diagnosis not present

## 2020-10-04 DIAGNOSIS — Z8679 Personal history of other diseases of the circulatory system: Secondary | ICD-10-CM | POA: Diagnosis not present

## 2020-10-04 DIAGNOSIS — K7689 Other specified diseases of liver: Secondary | ICD-10-CM | POA: Diagnosis not present

## 2020-10-10 ENCOUNTER — Telehealth: Payer: Self-pay

## 2020-10-10 ENCOUNTER — Telehealth: Payer: Self-pay | Admitting: Internal Medicine

## 2020-10-10 ENCOUNTER — Encounter: Payer: Self-pay | Admitting: Internal Medicine

## 2020-10-10 ENCOUNTER — Other Ambulatory Visit: Payer: Self-pay

## 2020-10-10 DIAGNOSIS — R932 Abnormal findings on diagnostic imaging of liver and biliary tract: Secondary | ICD-10-CM

## 2020-10-10 DIAGNOSIS — R4189 Other symptoms and signs involving cognitive functions and awareness: Secondary | ICD-10-CM

## 2020-10-10 DIAGNOSIS — N2889 Other specified disorders of kidney and ureter: Secondary | ICD-10-CM

## 2020-10-10 DIAGNOSIS — K709 Alcoholic liver disease, unspecified: Secondary | ICD-10-CM

## 2020-10-10 NOTE — Telephone Encounter (Signed)
-----   Message from Gatha Mayer, MD sent at 10/10/2020  7:57 AM EDT ----- Mr. Ryan Moreno,  The liver looks ok I think - I do not think you have cirrhosis most likely.  As many of these tests do, they turn up other things and there is a small lesion in a kidney that needs further evaluation.  My RN Barbera Setters will contact you about:  1) MRI abdomen with and without contrast re: left kidney mass and ? Cirrhosis by CT  2) Follow-up to be determined after I see these results

## 2020-10-10 NOTE — Telephone Encounter (Signed)
Reviewed MBS and Zenker's   He had seen ENT in [place] a few times and has not pursued Tx  He has upcomingMR ofabd re: kidney lesion  C/o brain fog  Orders Placed This Encounter  Procedures  . Vitamin B1  . Vitamin B6  . Folate  . Vitamin B2, Whole Blood

## 2020-10-10 NOTE — Telephone Encounter (Signed)
Left message for patient to please call back. 

## 2020-10-11 ENCOUNTER — Other Ambulatory Visit (INDEPENDENT_AMBULATORY_CARE_PROVIDER_SITE_OTHER): Payer: Medicare Other

## 2020-10-11 DIAGNOSIS — R4189 Other symptoms and signs involving cognitive functions and awareness: Secondary | ICD-10-CM | POA: Diagnosis not present

## 2020-10-11 DIAGNOSIS — K709 Alcoholic liver disease, unspecified: Secondary | ICD-10-CM

## 2020-10-11 DIAGNOSIS — R413 Other amnesia: Secondary | ICD-10-CM | POA: Diagnosis not present

## 2020-10-11 LAB — FOLATE: Folate: 7.2 ng/mL (ref >5.9)

## 2020-10-17 LAB — VITAMIN B1: Vitamin B1 (Thiamine): 8 nmol/L (ref 8–30)

## 2020-10-17 LAB — VITAMIN B6: Vitamin B6: 16.3 ng/mL (ref 2.1–21.7)

## 2020-10-18 ENCOUNTER — Ambulatory Visit (HOSPITAL_COMMUNITY): Payer: Medicare Other

## 2020-10-19 ENCOUNTER — Ambulatory Visit (HOSPITAL_COMMUNITY)
Admission: RE | Admit: 2020-10-19 | Discharge: 2020-10-19 | Disposition: A | Discharge status: Home / Self Care | Payer: Medicare Other | Source: Ambulatory Visit | Attending: Internal Medicine | Admitting: Internal Medicine

## 2020-10-19 ENCOUNTER — Other Ambulatory Visit: Payer: Self-pay

## 2020-10-19 DIAGNOSIS — R932 Abnormal findings on diagnostic imaging of liver and biliary tract: Secondary | ICD-10-CM

## 2020-10-19 DIAGNOSIS — N2889 Other specified disorders of kidney and ureter: Secondary | ICD-10-CM | POA: Diagnosis not present

## 2020-10-19 DIAGNOSIS — K760 Fatty (change of) liver, not elsewhere classified: Secondary | ICD-10-CM | POA: Diagnosis not present

## 2020-10-19 LAB — VITAMIN B2, WHOLE BLOOD: Vitamin B2, Whole Blood: 190 ug/L (ref 137–370)

## 2020-10-19 MED ORDER — GADOBUTROL 1 MMOL/ML IV SOLN
9.0000 mL | Freq: Once | INTRAVENOUS | Status: AC | PRN
  Administered 2020-10-19: 9 mL via INTRAVENOUS

## 2020-10-21 ENCOUNTER — Other Ambulatory Visit: Payer: Self-pay | Admitting: Internal Medicine

## 2020-10-21 MED ORDER — THIAMINE MONONITRATE 100 MG PO TABS: 100.0000 mg | ORAL_TABLET | Freq: Every day | ORAL | Status: AC

## 2020-11-01 ENCOUNTER — Telehealth: Payer: Self-pay

## 2020-11-01 NOTE — Telephone Encounter (Signed)
Patient informed thru [facility] that his referral has been placed with [facility] and they will contact him to set up an appointment with either Dr Wilburn Cornelia or Dr Melida Quitter. Their phone # is [phone], fax # [phone].  His information has been faxed over to them and I got confirmation it went thru.

## 2020-11-09 NOTE — Telephone Encounter (Signed)
Patient informed us that his appointment is 11/22/20 at 8:30AM.

## 2020-11-12 DIAGNOSIS — I700 Atherosclerosis of aorta: Secondary | ICD-10-CM | POA: Diagnosis not present

## 2020-11-12 DIAGNOSIS — I10 Essential (primary) hypertension: Secondary | ICD-10-CM | POA: Diagnosis not present

## 2020-11-12 DIAGNOSIS — R410 Disorientation, unspecified: Secondary | ICD-10-CM | POA: Diagnosis not present

## 2020-11-12 DIAGNOSIS — E7800 Pure hypercholesterolemia, unspecified: Secondary | ICD-10-CM | POA: Diagnosis not present

## 2020-11-12 DIAGNOSIS — M1A9XX0 Chronic gout, unspecified, without tophus (tophi): Secondary | ICD-10-CM | POA: Diagnosis not present

## 2020-11-22 ENCOUNTER — Other Ambulatory Visit: Payer: Self-pay

## 2020-11-22 ENCOUNTER — Encounter: Payer: Self-pay | Admitting: Neurology

## 2020-11-22 ENCOUNTER — Ambulatory Visit: Payer: Medicare Other | Admitting: Neurology

## 2020-11-22 VITALS — BP 156/85 | HR 67 | Ht 69.75 in | Wt 188.5 lb

## 2020-11-22 DIAGNOSIS — R413 Other amnesia: Secondary | ICD-10-CM | POA: Diagnosis not present

## 2020-11-22 DIAGNOSIS — K225 Diverticulum of esophagus, acquired: Secondary | ICD-10-CM | POA: Diagnosis not present

## 2020-11-22 DIAGNOSIS — R059 Cough, unspecified: Secondary | ICD-10-CM | POA: Diagnosis not present

## 2020-11-22 NOTE — Progress Notes (Signed)
Chief Complaint  Patient presents with   New Patient (Initial Visit)    Room 13 alone. Referral for transient disorientation - partial seizure vs TIA. Episodes of sudden onset of "brain fog". Denies any loss of consciousness. It last for various amounts of time. Says he does not sleep well.      ASSESSMENT AND PLAN  Ryan Moreno is a 75 y.o. male   Mild cognitive impairment  MoCA examination 27/30  Laboratory evaluation showed no treatable etiology  Complete evaluation with MRI of the brain without contrast  Continue moderate exercise, increase water intake, better sleep quality   DIAGNOSTIC DATA (LABS, IMAGING, TESTING) - I reviewed patient records, labs, notes, testing and imaging myself where available. Laboratory evaluations in 2022: Normal or negative vitamin B1, B6, folic acid, E95, CBC hemoglobin of 14.1, TSH, CMP, creatinine 1.06  HISTORICAL  Ryan Moreno is a 75 year old male, seen in request by his primary care physician Dr. Sela Hilding for evaluation of transient episode of memory loss, initial evaluation was on November 22, 2020    I reviewed and summarized the referring note. PMHx. Hypertension, Gout History of moderate alcohol use, quit in 2021  He is a retired Secondary school teacher, was noted to have mild memory loss since 2020, no limitation in his activity, he reported he has excellent long-term memory, but often take a while for him to remember what he had for breakfast, he also reported episodes of transient memory loss, lasting less than 1 minute, and then it will came back to him  He gave me few examples, he was driving in unfamiliar route, suddenly he felt a film came over, he could not think clear, does not know preextraction history ago, then it will quickly cleared up, he denied loss of consciousness, no visualized  He also gave me examples woke up this morning felt brain foggy sensation, lasting for few seconds, quickly resolved,  He used to drink  moderate alcohol on a daily basis, 7-10 drinks daily, quitting 2021, did not notice any significant change, he is still highly function, walks 3 to 4 miles each day, try to catch enough sleep, lives at home with his wife,    PHYSICAL EXAM:   Vitals:   11/22/20 1431  BP: (!) 156/85  Pulse: 67  Weight: 188 lb 8 oz (85.5 kg)  Height: 5' 9.75" (1.772 m)   Not recorded     Body mass index is 27.24 kg/m.  PHYSICAL EXAMNIATION:  Gen: NAD, conversant, well nourised, well groomed                     Cardiovascular: Regular rate rhythm, no peripheral edema, warm, nontender. Eyes: Conjunctivae clear without exudates or hemorrhage Neck: Supple, no carotid bruits. Pulmonary: Clear to auscultation bilaterally   NEUROLOGICAL EXAM:  MENTAL STATUS: Speech:    Speech is normal; fluent and spontaneous with normal comprehension.  Cognition:     Orientation to time, place and person     Normal recent and remote memory     Normal Attention span and concentration     Normal Language, naming, repeating,spontaneous speech     Fund of knowledge   CRANIAL NERVES: CN II: Visual fields are full to confrontation. Pupils are round equal and briskly reactive to light. CN III, IV, VI: extraocular movement are normal. No ptosis. CN V: Facial sensation is intact to light touch CN VII: Face is symmetric with normal eye closure  CN VIII: Hearing is normal to causal  conversation. CN IX, X: Phonation is normal. CN XI: Head turning and shoulder shrug are intact  MOTOR: There is no pronator drift of out-stretched arms. Muscle bulk and tone are normal. Muscle strength is normal.  Deformity of the right index fingers  REFLEXES: Reflexes are 2+ and symmetric at the biceps, triceps, knees, and ankles. Plantar responses are flexor.  SENSORY: Intact to light touch, pinprick and vibratory sensation are intact in fingers and toes.  COORDINATION: There is no trunk or limb dysmetria  noted.  GAIT/STANCE: Posture is normal. Gait is normal  REVIEW OF SYSTEMS:  Full 14 system review of systems performed and notable only for as above All other review of systems were negative.   ALLERGIES: Allergies  Allergen Reactions   Lisinopril Other (See Comments)    "Brain fog"   Compazine [Prochlorperazine Edisylate]     Swelling of throat    HOME MEDICATIONS: Current Outpatient Medications  Medication Sig Dispense Refill   allopurinol (ZYLOPRIM) 100 MG tablet Take 50 mg by mouth daily.     Cyanocobalamin (B-12) 5000 MCG CAPS Take 1 mcg by mouth daily.     indomethacin (INDOCIN) 25 MG capsule Take 1 capsule by mouth 3 (three) times daily with meals.     Probiotic Product (PROBIOTIC PO) Take 1 tablet by mouth daily.     sildenafil (REVATIO) 20 MG tablet TAKE 2 TO 3 TABLETS BY MOUTH AS DIRECTED     thiamine (VITAMIN B-1) 100 MG tablet Take 1 tablet (100 mg total) by mouth daily.     UNABLE TO FIND Med Name: CBD oil 3mg  daily.     No current facility-administered medications for this visit.    PAST MEDICAL HISTORY: Past Medical History:  Diagnosis Date   Alcoholic liver disease ([place])    Allergy    Arthritis    Ascending aortic aneurysm ([facility])    Chronic cough    Hx of colonic polyps 06/27/2016   2004 polyps reported 2012 diminutive adenoma 06/27/2016 7 diminutive polyps removed ALL adenomas recall 2021    Hyperlipidemia    Hypertension    Transient disorientation    Zenker's (hypopharyngeal) diverticulum     PAST SURGICAL HISTORY: Past Surgical History:  Procedure Laterality Date   COLONOSCOPY  2012, 06/27/2016   HERNIA REPAIR      FAMILY HISTORY: Family History  Problem Relation Age of Onset   Alcoholism Mother    Emphysema Father    Esophageal cancer Neg Hx    Rectal cancer Neg Hx    Stomach cancer Neg Hx    Colon polyps Neg Hx    Colon cancer Neg Hx     SOCIAL HISTORY: Social History   Socioeconomic History   Marital status: Married     Spouse name: Not on file   Number of children: 3   Years of education: college   Highest education level: Master's degree (e.g., MA, MS, MEng, MEd, MSW, MBA)  Occupational History   Not on file  Tobacco Use   Smoking status: Never   Smokeless tobacco: Never  Vaping Use   Vaping Use: Never used  Substance and Sexual Activity   Alcohol use: Not Currently    Comment: Hx of heavy alcohol use. Reports no use in last year.   Drug use: No   Sexual activity: Not on file  Other Topics Concern   Not on file  Social History Narrative   Lives at home with his wife.   Right-handed.   Two cups  caffeine per day.   Social Determinants of Health   Financial Resource Strain: Not on file  Food Insecurity: Not on file  Transportation Needs: Not on file  Physical Activity: Not on file  Stress: Not on file  Social Connections: Not on file  Intimate Partner Violence: Not on file      Marcial Pacas, M.D. Ph.D.  [facility] [address] [place], [place] [address] Ph: [phone] Fax: [phone]  CC:  Glenis Smoker, MD [place],  [place] [id number]  Glenis Smoker, MD

## 2020-11-23 ENCOUNTER — Ambulatory Visit
Admission: RE | Admit: 2020-11-23 | Discharge: 2020-11-23 | Disposition: A | Discharge status: Home / Self Care | Payer: Medicare Other | Source: Ambulatory Visit | Attending: Neurology | Admitting: Neurology

## 2020-11-23 ENCOUNTER — Other Ambulatory Visit: Payer: Self-pay

## 2020-11-23 DIAGNOSIS — R413 Other amnesia: Secondary | ICD-10-CM | POA: Diagnosis not present

## 2020-11-26 ENCOUNTER — Telehealth: Payer: Self-pay | Admitting: Neurology

## 2020-11-26 NOTE — Telephone Encounter (Signed)
I spoke to the patient and provided him with the MRI brain results. He was also scheduled for a follow up appointment.

## 2020-11-26 NOTE — Telephone Encounter (Signed)
  IMPRESSION: This MRI of the brain without contrast shows the following: 1.   Mild generalized cortical atrophy, typical for age. 2.   Few scattered T2/FLAIR hyperintense foci in the hemispheres with one focus in the anterior limb of the right internal capsule.  None of these appear to be acute. 3.   No acute findings.  Please call patient, MRI of the brain showed generalized atrophy, and small vessel disease, no evidence of acute abnormalities.  If he has questions about his MRI findings, may give him a follow-up visit with me, it is okay to 5-6 months, to give him a chance to see the clinical changes

## 2020-12-04 DIAGNOSIS — M65342 Trigger finger, left ring finger: Secondary | ICD-10-CM | POA: Diagnosis not present

## 2020-12-04 DIAGNOSIS — H2513 Age-related nuclear cataract, bilateral: Secondary | ICD-10-CM | POA: Diagnosis not present

## 2020-12-04 DIAGNOSIS — M1A9XX1 Chronic gout, unspecified, with tophus (tophi): Secondary | ICD-10-CM | POA: Diagnosis not present

## 2021-01-12 DIAGNOSIS — Z23 Encounter for immunization: Secondary | ICD-10-CM | POA: Diagnosis not present

## 2021-01-22 ENCOUNTER — Ambulatory Visit: Payer: Medicare Other | Admitting: Internal Medicine

## 2021-01-22 ENCOUNTER — Encounter: Payer: Self-pay | Admitting: Internal Medicine

## 2021-01-22 VITALS — BP 126/70 | HR 65 | Ht 69.75 in | Wt 179.0 lb

## 2021-01-22 DIAGNOSIS — K760 Fatty (change of) liver, not elsewhere classified: Secondary | ICD-10-CM | POA: Diagnosis not present

## 2021-01-22 DIAGNOSIS — K225 Diverticulum of esophagus, acquired: Secondary | ICD-10-CM

## 2021-01-22 NOTE — Patient Instructions (Signed)
If you are age 77 or older, your body mass index should be between 23-30. Your Body mass index is 25.87 kg/m. If this is out of the aforementioned range listed, please consider follow up with your Primary Care Provider.  If you are age 9 or younger, your body mass index should be between 19-25. Your Body mass index is 25.87 kg/m. If this is out of the aformentioned range listed, please consider follow up with your Primary Care Provider.   __________________________________________________________  The [facility] providers would like to encourage you to use [facility] to communicate with providers for non-urgent requests or questions.  Due to long hold times on the telephone, sending your provider a message by [facility] may be a faster and more efficient way to get a response.  Please allow 48 business hours for a response.  Please remember that this is for non-urgent requests.   Keep up the good work and continue to cut back on processed foods.  I appreciate the opportunity to care for you. Silvano Rusk, MD, [facility]

## 2021-01-22 NOTE — Progress Notes (Signed)
   Ryan Moreno 75 y.o. 1946/05/04 [id number]  Assessment & Plan:   Encounter Diagnoses  Name Primary?   Hepatic steatosis Yes   Zenker's diverticulum     Continue abstinence from alcohol reducing processed food regarding the steatosis.  Follow-up GI as needed.  See ENT as needed Zenker's diverticulum this is improved regarding symptomatology.  I appreciate the opportunity to care for this patient. CC: Ryan Smoker, MD    Subjective:   Chief Complaint: Follow-up of hepatic steatosis and Zenker's diverticulum  HPI Ryan Moreno returns for follow-up, I had seen him in the spring for question of cirrhosis raised on imaging, ultrasound and then MRI do not suggest cirrhosis LFTs normal but he did have hepatic steatosis.  He was drinking a significant or large amount of alcohol which she has stopped.  He is feeling better he has also reduced processed foods and has dropped 8 pounds.  A Zenker's diverticulum was picked up as part of a dysphagia evaluation and he saw ENT and this will be monitored.  He has had some cough issues related to that but these are improved with the weight loss.  He had some "brain fog" and I did some vitamin testing etc. as outlined in the chart and he also saw neurology and had an MRI of the brain and no significant abnormalities were turned up. Allergies  Allergen Reactions   Lisinopril Other (See Comments)    "Brain fog"   Compazine [Prochlorperazine Edisylate]     Swelling of throat   Current Meds  Medication Sig   allopurinol (ZYLOPRIM) 100 MG tablet Take 50 mg by mouth daily.   Cyanocobalamin (B-12) 5000 MCG CAPS Take 1 mcg by mouth daily.   indomethacin (INDOCIN) 25 MG capsule Take 1 capsule by mouth 3 (three) times daily with meals.   losartan (COZAAR) 25 MG tablet Take 25 mg by mouth daily.   Probiotic Product (PROBIOTIC PO) Take 1 tablet by mouth daily.   rosuvastatin (CRESTOR) 10 MG tablet Take 10 mg by mouth daily.   sildenafil (REVATIO)  20 MG tablet TAKE 2 TO 3 TABLETS BY MOUTH AS DIRECTED   thiamine (VITAMIN B-1) 100 MG tablet Take 1 tablet (100 mg total) by mouth daily.   UNABLE TO FIND Med Name: CBD oil '3mg'$  daily.   [DISCONTINUED] thiamine 100 MG tablet Take by mouth.   Past Medical History:  Diagnosis Date   Alcoholic liver disease ([place])    Allergy    Arthritis    Ascending aortic aneurysm ([place])    Chronic cough    Hx of colonic polyps 06/27/2016   2004 polyps reported 2012 diminutive adenoma 06/27/2016 7 diminutive polyps removed ALL adenomas recall 2021    Hyperlipidemia    Hypertension    Transient disorientation    Zenker's (hypopharyngeal) diverticulum    Past Surgical History:  Procedure Laterality Date   COLONOSCOPY  2012, 06/27/2016   HERNIA REPAIR     Social History   Social History Narrative   Lives at home with his wife.   Right-handed.   Two cups caffeine per day.   family history includes Alcoholism in his mother; Emphysema in his father.   Review of Systems As above  Objective:   Physical Exam BP 126/70   Pulse 65   Ht 5' 9.75" (1.772 m)   Wt 179 lb (81.2 kg)   SpO2 97%   BMI 25.87 kg/m

## 2021-01-30 DIAGNOSIS — L538 Other specified erythematous conditions: Secondary | ICD-10-CM | POA: Diagnosis not present

## 2021-01-30 DIAGNOSIS — C44629 Squamous cell carcinoma of skin of left upper limb, including shoulder: Secondary | ICD-10-CM | POA: Diagnosis not present

## 2021-01-30 DIAGNOSIS — Z08 Encounter for follow-up examination after completed treatment for malignant neoplasm: Secondary | ICD-10-CM | POA: Diagnosis not present

## 2021-01-30 DIAGNOSIS — L814 Other melanin hyperpigmentation: Secondary | ICD-10-CM | POA: Diagnosis not present

## 2021-01-30 DIAGNOSIS — R208 Other disturbances of skin sensation: Secondary | ICD-10-CM | POA: Diagnosis not present

## 2021-01-30 DIAGNOSIS — L821 Other seborrheic keratosis: Secondary | ICD-10-CM | POA: Diagnosis not present

## 2021-01-30 DIAGNOSIS — L820 Inflamed seborrheic keratosis: Secondary | ICD-10-CM | POA: Diagnosis not present

## 2021-01-30 DIAGNOSIS — Z85828 Personal history of other malignant neoplasm of skin: Secondary | ICD-10-CM | POA: Diagnosis not present

## 2021-01-30 DIAGNOSIS — D1801 Hemangioma of skin and subcutaneous tissue: Secondary | ICD-10-CM | POA: Diagnosis not present

## 2021-01-30 DIAGNOSIS — Z789 Other specified health status: Secondary | ICD-10-CM | POA: Diagnosis not present

## 2021-01-30 DIAGNOSIS — D485 Neoplasm of uncertain behavior of skin: Secondary | ICD-10-CM | POA: Diagnosis not present

## 2021-03-13 DIAGNOSIS — R059 Cough, unspecified: Secondary | ICD-10-CM | POA: Diagnosis not present

## 2021-03-13 DIAGNOSIS — Z03818 Encounter for observation for suspected exposure to other biological agents ruled out: Secondary | ICD-10-CM | POA: Diagnosis not present

## 2021-03-13 DIAGNOSIS — Z20822 Contact with and (suspected) exposure to covid-19: Secondary | ICD-10-CM | POA: Diagnosis not present

## 2021-03-27 DIAGNOSIS — M1A9XX1 Chronic gout, unspecified, with tophus (tophi): Secondary | ICD-10-CM | POA: Diagnosis not present

## 2021-03-27 DIAGNOSIS — M65342 Trigger finger, left ring finger: Secondary | ICD-10-CM | POA: Diagnosis not present

## 2021-04-22 DIAGNOSIS — M1A9XX0 Chronic gout, unspecified, without tophus (tophi): Secondary | ICD-10-CM | POA: Diagnosis not present

## 2021-04-22 DIAGNOSIS — Z0000 Encounter for general adult medical examination without abnormal findings: Secondary | ICD-10-CM | POA: Diagnosis not present

## 2021-04-22 DIAGNOSIS — I714 Abdominal aortic aneurysm, without rupture, unspecified: Secondary | ICD-10-CM | POA: Diagnosis not present

## 2021-04-22 DIAGNOSIS — I700 Atherosclerosis of aorta: Secondary | ICD-10-CM | POA: Diagnosis not present

## 2021-04-22 DIAGNOSIS — E7800 Pure hypercholesterolemia, unspecified: Secondary | ICD-10-CM | POA: Diagnosis not present

## 2021-04-22 DIAGNOSIS — I10 Essential (primary) hypertension: Secondary | ICD-10-CM | POA: Diagnosis not present

## 2021-04-23 ENCOUNTER — Telehealth: Payer: Self-pay | Admitting: Pulmonary Disease

## 2021-04-23 NOTE — Telephone Encounter (Signed)
Dawn calling from Dr Toni Amend office- Pt is due for CT Arthrogram/MR Arthrogram in the spring. Dr Lindell Noe is wanting to know if RA will get the lung images he needs from a CTA. If so, Dr Lindell Noe will order CTA instead of the HRCT. Trying to save pt from multiple CT's. Please advise [phone]) [phone] # if Dawn doesn't answer)

## 2021-04-23 NOTE — Telephone Encounter (Signed)
Rigoberto Noel, MD  You 14 minutes ago (4:54 PM)   CTA chest may not be as good as HRC but ok  We can then review & decide if HRCT  is needed or not    Attempted to call Dawn but unable to reach. Left message for her to return call.

## 2021-04-23 NOTE — Telephone Encounter (Signed)
Routing message to Dr. Elsworth Soho for him to review the message from [facility] with Dr. Rosario Jacks office. Please advise.

## 2021-05-02 DIAGNOSIS — M1A9XX1 Chronic gout, unspecified, with tophus (tophi): Secondary | ICD-10-CM | POA: Diagnosis not present

## 2021-05-02 DIAGNOSIS — M65342 Trigger finger, left ring finger: Secondary | ICD-10-CM | POA: Diagnosis not present

## 2021-05-07 NOTE — Telephone Encounter (Signed)
Return call from [facility] she can be reached @ [phone].Hillery Hunter

## 2021-05-07 NOTE — Telephone Encounter (Signed)
I called and left detailed msg on voicemail for Dawn, advised to call back only if further questions. Closing encounter.

## 2021-05-21 ENCOUNTER — Other Ambulatory Visit: Payer: Self-pay | Admitting: Family Medicine

## 2021-05-21 DIAGNOSIS — I7121 Aneurysm of the ascending aorta, without rupture: Secondary | ICD-10-CM

## 2021-05-27 ENCOUNTER — Ambulatory Visit: Payer: Medicare Other | Admitting: Neurology

## 2021-05-28 DIAGNOSIS — Z03818 Encounter for observation for suspected exposure to other biological agents ruled out: Secondary | ICD-10-CM | POA: Diagnosis not present

## 2021-05-28 DIAGNOSIS — Z20822 Contact with and (suspected) exposure to covid-19: Secondary | ICD-10-CM | POA: Diagnosis not present

## 2021-05-30 DIAGNOSIS — R053 Chronic cough: Secondary | ICD-10-CM | POA: Diagnosis not present

## 2021-05-30 DIAGNOSIS — K225 Diverticulum of esophagus, acquired: Secondary | ICD-10-CM | POA: Diagnosis not present

## 2021-06-13 ENCOUNTER — Ambulatory Visit
Admission: RE | Admit: 2021-06-13 | Discharge: 2021-06-13 | Disposition: A | Discharge status: Home / Self Care | Payer: Medicare Other | Source: Ambulatory Visit | Attending: Family Medicine | Admitting: Family Medicine

## 2021-06-13 ENCOUNTER — Other Ambulatory Visit: Payer: Self-pay

## 2021-06-13 DIAGNOSIS — I2584 Coronary atherosclerosis due to calcified coronary lesion: Secondary | ICD-10-CM | POA: Diagnosis not present

## 2021-06-13 DIAGNOSIS — I7121 Aneurysm of the ascending aorta, without rupture: Secondary | ICD-10-CM

## 2021-06-13 MED ORDER — IOPAMIDOL (ISOVUE-370) INJECTION 76%
75.0000 mL | Freq: Once | INTRAVENOUS | Status: AC | PRN
  Administered 2021-06-13: 75 mL via INTRAVENOUS

## 2021-07-11 ENCOUNTER — Other Ambulatory Visit: Payer: Self-pay | Admitting: Otolaryngology

## 2021-07-12 NOTE — Pre-Procedure Instructions (Signed)
Surgical Instructions    Your procedure is scheduled on Wednesday, March 1st.  Report to [facility] Main Entrance "A" at 09:00 A.M., then check in with the Admitting office.  Call this number if you have problems the morning of surgery:  [phone]   If you have any questions prior to your surgery date call [phone]: Open Monday-Friday 8am-4pm    Remember:  Do not eat after midnight the night before your surgery  You may drink clear liquids until 08:00 AM the morning of your surgery.   Clear liquids allowed are: Water, Non-Citrus Juices (without pulp), Carbonated Beverages, Clear Tea, Black Coffee Only (NO MILK, CREAM OR POWDERED CREAMER of any kind), and Gatorade.    Take these medicines the morning of surgery with A SIP OF WATER  allopurinol (ZYLOPRIM) rosuvastatin (CRESTOR)   As of today, STOP taking any Aspirin (unless otherwise instructed by your surgeon) Aleve, Naproxen, Ibuprofen, Motrin, Advil, Goody's, BC's, all herbal medications, fish oil, and all vitamins.                     Do NOT Smoke (Tobacco/Vaping) for 24 hours prior to your procedure.  If you use a CPAP at night, you may bring your mask/headgear for your overnight stay.   Contacts, glasses, piercing's, hearing aid's, dentures or partials may not be worn into surgery, please bring cases for these belongings.    For patients admitted to the hospital, discharge time will be determined by your treatment team.   Patients discharged the day of surgery will not be allowed to drive home, and someone needs to stay with them for 24 hours.  NO VISITORS WILL BE ALLOWED IN PRE-OP WHERE PATIENTS ARE PREPPED FOR SURGERY.  ONLY 1 SUPPORT PERSON MAY BE PRESENT IN THE WAITING ROOM WHILE YOU ARE IN SURGERY.  IF YOU ARE TO BE ADMITTED, ONCE YOU ARE IN YOUR ROOM YOU WILL BE ALLOWED TWO (2) VISITORS. (1) VISITOR MAY STAY OVERNIGHT BUT MUST ARRIVE TO THE ROOM BY 8pm.  Minor children may have two parents present. Special  consideration for safety and communication needs will be reviewed on a case by case basis.   Special instructions:   [place]- Preparing For Surgery  Before surgery, you can play an important role. Because skin is not sterile, your skin needs to be as free of germs as possible. You can reduce the number of germs on your skin by washing with CHG (chlorahexidine gluconate) Soap before surgery.  CHG is an antiseptic cleaner which kills germs and bonds with the skin to continue killing germs even after washing.    Oral Hygiene is also important to reduce your risk of infection.  Remember - BRUSH YOUR TEETH THE MORNING OF SURGERY WITH YOUR REGULAR TOOTHPASTE  Please do not use if you have an allergy to CHG or antibacterial soaps. If your skin becomes reddened/irritated stop using the CHG.  Do not shave (including legs and underarms) for at least 48 hours prior to first CHG shower. It is OK to shave your face.  Please follow these instructions carefully.   Shower the NIGHT BEFORE SURGERY and the MORNING OF SURGERY  If you chose to wash your hair, wash your hair first as usual with your normal shampoo.  After you shampoo, rinse your hair and body thoroughly to remove the shampoo.  Use CHG Soap as you would any other liquid soap. You can apply CHG directly to the skin and wash gently with a scrungie  or a clean washcloth.   Apply the CHG Soap to your body ONLY FROM THE NECK DOWN.  Do not use on open wounds or open sores. Avoid contact with your eyes, ears, mouth and genitals (private parts). Wash Face and genitals (private parts)  with your normal soap.   Wash thoroughly, paying special attention to the area where your surgery will be performed.  Thoroughly rinse your body with warm water from the neck down.  DO NOT shower/wash with your normal soap after using and rinsing off the CHG Soap.  Pat yourself dry with a CLEAN TOWEL.  Wear CLEAN PAJAMAS to bed the night before surgery  Place  CLEAN SHEETS on your bed the night before your surgery  DO NOT SLEEP WITH PETS.   Day of Surgery: Shower with CHG soap. Do not wear jewelry Do not wear lotions, powders, colognes, or deodorant. Do not shave 48 hours prior to surgery.  Men may shave face and neck. Do not bring valuables to the [facility] is not responsible for any belongings or valuables. Wear Clean/Comfortable clothing the morning of surgery Remember to brush your teeth WITH YOUR REGULAR TOOTHPASTE.   Please read over the following fact sheets that you were given.   3 days prior to your procedure or After your COVID test   You are not required to quarantine however you are required to wear a well-fitting mask when you are out and around people not in your household. If your mask becomes wet or soiled, replace with a new one.   Wash your hands often with soap and water for 20 seconds or clean your hands with an alcohol-based hand sanitizer that contains at least 60% alcohol.   Do not share personal items.   Notify your provider:  o if you are in close contact with someone who has COVID  o or if you develop a fever of 100.4 or greater, sneezing, cough, sore throat, shortness of breath or body aches.

## 2021-07-15 ENCOUNTER — Other Ambulatory Visit: Payer: Self-pay

## 2021-07-15 ENCOUNTER — Encounter (HOSPITAL_COMMUNITY): Payer: Self-pay

## 2021-07-15 ENCOUNTER — Other Ambulatory Visit (HOSPITAL_COMMUNITY): Payer: Medicare Other

## 2021-07-15 ENCOUNTER — Encounter (HOSPITAL_COMMUNITY)
Admission: RE | Admit: 2021-07-15 | Discharge: 2021-07-15 | Disposition: A | Discharge status: Home / Self Care | Payer: Medicare Other | Source: Ambulatory Visit | Attending: Otolaryngology | Admitting: Otolaryngology

## 2021-07-15 VITALS — BP 141/75 | HR 60 | Temp 97.8°F | Resp 18 | Ht 71.0 in | Wt 185.6 lb

## 2021-07-15 DIAGNOSIS — Z01818 Encounter for other preprocedural examination: Secondary | ICD-10-CM

## 2021-07-15 DIAGNOSIS — K760 Fatty (change of) liver, not elsewhere classified: Secondary | ICD-10-CM

## 2021-07-15 DIAGNOSIS — Z20822 Contact with and (suspected) exposure to covid-19: Secondary | ICD-10-CM | POA: Diagnosis not present

## 2021-07-15 LAB — COMPREHENSIVE METABOLIC PANEL
ALT: 18 U/L (ref 0–44)
AST: 17 U/L (ref 15–41)
Albumin: 4.4 g/dL (ref 3.5–5.0)
Alkaline Phosphatase: 62 U/L (ref 38–126)
Anion gap: 9 (ref 5–15)
BUN: 15 mg/dL (ref 8–23)
CO2: 24 mmol/L (ref 22–32)
Calcium: 9.5 mg/dL (ref 8.9–10.3)
Chloride: 107 mmol/L (ref 98–111)
Creatinine, Ser: 1.15 mg/dL (ref 0.61–1.24)
GFR, Estimated: >60 mL/min (ref >60)
Glucose, Bld: 112 mg/dL — ABNORMAL HIGH (ref 70–99)
Potassium: 4.4 mmol/L (ref 3.5–5.1)
Sodium: 140 mmol/L (ref 135–145)
Total Bilirubin: 0.9 mg/dL (ref 0.3–1.2)
Total Protein: 7.5 g/dL (ref 6.5–8.1)

## 2021-07-15 LAB — SARS CORONAVIRUS 2 BY RT PCR (HOSPITAL ORDER, PERFORMED IN ~~LOC~~ HOSPITAL LAB): SARS Coronavirus 2: NEGATIVE

## 2021-07-15 LAB — CBC
HCT: 38.7 % — ABNORMAL LOW (ref 39.0–52.0)
Hemoglobin: 13 g/dL (ref 13.0–17.0)
MCH: 33.7 pg (ref 26.0–34.0)
MCHC: 33.6 g/dL (ref 30.0–36.0)
MCV: 100.3 fL — ABNORMAL HIGH (ref 80.0–100.0)
Platelets: 168 10*3/uL (ref 150–400)
RBC: 3.86 MIL/uL — ABNORMAL LOW (ref 4.22–5.81)
RDW: 13.4 % (ref 11.5–15.5)
WBC: 7.8 10*3/uL (ref 4.0–10.5)
nRBC: 0 % (ref 0.0–0.2)

## 2021-07-15 NOTE — Progress Notes (Signed)
PCP - Dr. Sela Hilding Cardiologist - denies  PPM/ICD - denies   Chest x-ray - 04/09/16 EKG - 07/15/21 at PAT Stress Test - denies ECHO - denies Cardiac Cath - denies  Sleep Study - pt states he had a sleep study 15+ years ago and was negative for OSA   DM- denies  Blood Thinner Instructions: n/a Aspirin Instructions: n/a  ERAS Protcol - yes, no drink   COVID TEST- 07/15/21 at PAT   Anesthesia review: no  Patient denies shortness of breath, fever, cough and chest pain at PAT appointment   All instructions explained to the patient, with a verbal understanding of the material. Patient agrees to go over the instructions while at home for a better understanding. Patient also instructed to wear a mask in public after being tested for COVID-19. The opportunity to ask questions was provided.

## 2021-07-17 ENCOUNTER — Encounter (HOSPITAL_COMMUNITY): Payer: Self-pay | Admitting: Otolaryngology

## 2021-07-17 ENCOUNTER — Other Ambulatory Visit: Payer: Self-pay

## 2021-07-17 ENCOUNTER — Observation Stay (HOSPITAL_COMMUNITY)
Admission: RE | Admit: 2021-07-17 | Discharge: 2021-07-18 | Disposition: A | Discharge status: Home / Self Care | Payer: Medicare Other | Source: Ambulatory Visit | Attending: Otolaryngology | Admitting: Otolaryngology

## 2021-07-17 ENCOUNTER — Encounter (HOSPITAL_COMMUNITY)
Admission: RE | Disposition: A | Discharge status: Home / Self Care | Payer: Self-pay | Source: Ambulatory Visit | Attending: Otolaryngology

## 2021-07-17 ENCOUNTER — Ambulatory Visit (HOSPITAL_COMMUNITY): Payer: Medicare Other | Admitting: Physician Assistant

## 2021-07-17 ENCOUNTER — Ambulatory Visit (HOSPITAL_BASED_OUTPATIENT_CLINIC_OR_DEPARTMENT_OTHER): Payer: Medicare Other | Admitting: Certified Registered Nurse Anesthetist

## 2021-07-17 DIAGNOSIS — I10 Essential (primary) hypertension: Secondary | ICD-10-CM | POA: Insufficient documentation

## 2021-07-17 DIAGNOSIS — K225 Diverticulum of esophagus, acquired: Principal | ICD-10-CM | POA: Insufficient documentation

## 2021-07-17 DIAGNOSIS — Z79899 Other long term (current) drug therapy: Secondary | ICD-10-CM | POA: Insufficient documentation

## 2021-07-17 HISTORY — PX: ZENKER'S DIVERTICULECTOMY ENDOSCOPIC: SHX5224

## 2021-07-17 SURGERY — ZENKER'S DIVERTICULECTOMY ENDOSCOPIC
Anesthesia: General | Site: Mouth

## 2021-07-17 MED ORDER — THIAMINE HCL 100 MG PO TABS
100.0000 mg | ORAL_TABLET | Freq: Every day | ORAL | Status: DC
  Administered 2021-07-17 – 2021-07-18 (×2): 100 mg via ORAL
  Filled 2021-07-17 (×3): qty 1

## 2021-07-17 MED ORDER — ACETAMINOPHEN 500 MG PO TABS: 1000.0000 mg | ORAL_TABLET | Freq: Once | ORAL | Status: DC | PRN

## 2021-07-17 MED ORDER — ONDANSETRON HCL 4 MG/2ML IJ SOLN
INTRAMUSCULAR | Status: AC
  Filled 2021-07-17: qty 2

## 2021-07-17 MED ORDER — HYDROCODONE-ACETAMINOPHEN 7.5-325 MG/15ML PO SOLN: 10.0000 mL | ORAL | Status: DC | PRN

## 2021-07-17 MED ORDER — ALLOPURINOL 100 MG PO TABS
100.0000 mg | ORAL_TABLET | Freq: Every day | ORAL | Status: DC
Start: 2021-07-18 — End: 2021-07-18
  Administered 2021-07-18: 100 mg via ORAL
  Filled 2021-07-17: qty 1

## 2021-07-17 MED ORDER — GLYCOPYRROLATE PF 0.2 MG/ML IJ SOSY
PREFILLED_SYRINGE | INTRAMUSCULAR | Status: DC | PRN
  Administered 2021-07-17: .1 mg via INTRAVENOUS

## 2021-07-17 MED ORDER — SUCCINYLCHOLINE CHLORIDE 200 MG/10ML IV SOSY
PREFILLED_SYRINGE | INTRAVENOUS | Status: DC | PRN
  Administered 2021-07-17: 120 mg via INTRAVENOUS

## 2021-07-17 MED ORDER — OXYMETAZOLINE HCL 0.05 % NA SOLN
NASAL | Status: AC
  Filled 2021-07-17: qty 30

## 2021-07-17 MED ORDER — ONDANSETRON HCL 4 MG/2ML IJ SOLN
INTRAMUSCULAR | Status: DC | PRN
  Administered 2021-07-17: 4 mg via INTRAVENOUS

## 2021-07-17 MED ORDER — INDOMETHACIN 25 MG PO CAPS
25.0000 mg | ORAL_CAPSULE | Freq: Every day | ORAL | Status: DC | PRN
  Filled 2021-07-17: qty 1

## 2021-07-17 MED ORDER — OXYCODONE HCL 5 MG/5ML PO SOLN: 5.0000 mg | Freq: Once | ORAL | Status: DC | PRN

## 2021-07-17 MED ORDER — 0.9 % SODIUM CHLORIDE (POUR BTL) OPTIME
TOPICAL | Status: DC | PRN
  Administered 2021-07-17: 1000 mL

## 2021-07-17 MED ORDER — EPINEPHRINE HCL (NASAL) 0.1 % NA SOLN
NASAL | Status: AC
  Filled 2021-07-17: qty 30

## 2021-07-17 MED ORDER — PROPOFOL 10 MG/ML IV BOLUS
INTRAVENOUS | Status: AC
  Filled 2021-07-17: qty 20

## 2021-07-17 MED ORDER — ROCURONIUM BROMIDE 10 MG/ML (PF) SYRINGE
PREFILLED_SYRINGE | INTRAVENOUS | Status: AC
  Filled 2021-07-17: qty 10

## 2021-07-17 MED ORDER — LIDOCAINE 2% (20 MG/ML) 5 ML SYRINGE
INTRAMUSCULAR | Status: DC | PRN
  Administered 2021-07-17: 60 mg via INTRAVENOUS

## 2021-07-17 MED ORDER — DEXAMETHASONE SODIUM PHOSPHATE 10 MG/ML IJ SOLN
INTRAMUSCULAR | Status: DC | PRN
  Administered 2021-07-17: 10 mg via INTRAVENOUS

## 2021-07-17 MED ORDER — ESMOLOL HCL 100 MG/10ML IV SOLN
INTRAVENOUS | Status: DC | PRN
  Administered 2021-07-17: 20 mg via INTRAVENOUS
  Administered 2021-07-17: 30 mg via INTRAVENOUS

## 2021-07-17 MED ORDER — ROCURONIUM BROMIDE 10 MG/ML (PF) SYRINGE
PREFILLED_SYRINGE | INTRAVENOUS | Status: DC | PRN
  Administered 2021-07-17: 20 mg via INTRAVENOUS
  Administered 2021-07-17: 30 mg via INTRAVENOUS

## 2021-07-17 MED ORDER — PROPOFOL 10 MG/ML IV BOLUS
INTRAVENOUS | Status: DC | PRN
  Administered 2021-07-17: 150 mg via INTRAVENOUS
  Administered 2021-07-17: 50 mg via INTRAVENOUS

## 2021-07-17 MED ORDER — ACETAMINOPHEN 160 MG/5ML PO SOLN: 1000.0000 mg | Freq: Once | ORAL | Status: DC | PRN

## 2021-07-17 MED ORDER — ORAL CARE MOUTH RINSE: 15.0000 mL | Freq: Once | OROMUCOSAL | Status: AC

## 2021-07-17 MED ORDER — MORPHINE SULFATE (PF) 2 MG/ML IV SOLN: 2.0000 mg | INTRAVENOUS | Status: DC | PRN

## 2021-07-17 MED ORDER — FENTANYL CITRATE (PF) 250 MCG/5ML IJ SOLN
INTRAMUSCULAR | Status: AC
  Filled 2021-07-17: qty 5

## 2021-07-17 MED ORDER — FENTANYL CITRATE (PF) 100 MCG/2ML IJ SOLN
INTRAMUSCULAR | Status: DC | PRN
  Administered 2021-07-17 (×5): 50 ug via INTRAVENOUS

## 2021-07-17 MED ORDER — CHLORHEXIDINE GLUCONATE 0.12 % MT SOLN
15.0000 mL | Freq: Once | OROMUCOSAL | Status: AC
  Administered 2021-07-17: 15 mL via OROMUCOSAL
  Filled 2021-07-17: qty 15

## 2021-07-17 MED ORDER — KCL IN DEXTROSE-NACL 20-5-0.45 MEQ/L-%-% IV SOLN
INTRAVENOUS | Status: DC
  Filled 2021-07-17 (×3): qty 1000

## 2021-07-17 MED ORDER — ACETAMINOPHEN 10 MG/ML IV SOLN: 1000.0000 mg | Freq: Once | INTRAVENOUS | Status: DC | PRN

## 2021-07-17 MED ORDER — ROSUVASTATIN CALCIUM 5 MG PO TABS
10.0000 mg | ORAL_TABLET | Freq: Every day | ORAL | Status: DC
  Administered 2021-07-17 – 2021-07-18 (×2): 10 mg via ORAL
  Filled 2021-07-17 (×2): qty 2

## 2021-07-17 MED ORDER — LIDOCAINE 2% (20 MG/ML) 5 ML SYRINGE
INTRAMUSCULAR | Status: AC
  Filled 2021-07-17: qty 5

## 2021-07-17 MED ORDER — LOSARTAN POTASSIUM 50 MG PO TABS
25.0000 mg | ORAL_TABLET | Freq: Every day | ORAL | Status: DC
  Administered 2021-07-17 – 2021-07-18 (×2): 25 mg via ORAL
  Filled 2021-07-17 (×2): qty 1

## 2021-07-17 MED ORDER — VITAMIN B-12 1000 MCG PO TABS
5000.0000 ug | ORAL_TABLET | Freq: Every day | ORAL | Status: DC
  Administered 2021-07-18: 5000 ug via ORAL
  Filled 2021-07-17 (×2): qty 5

## 2021-07-17 MED ORDER — SUGAMMADEX SODIUM 200 MG/2ML IV SOLN
INTRAVENOUS | Status: DC | PRN
  Administered 2021-07-17: 300 mg via INTRAVENOUS

## 2021-07-17 MED ORDER — DEXMEDETOMIDINE (PRECEDEX) IN NS 20 MCG/5ML (4 MCG/ML) IV SYRINGE
PREFILLED_SYRINGE | INTRAVENOUS | Status: DC | PRN
  Administered 2021-07-17: 12 ug via INTRAVENOUS
  Administered 2021-07-17: 8 ug via INTRAVENOUS

## 2021-07-17 MED ORDER — DEXAMETHASONE SODIUM PHOSPHATE 10 MG/ML IJ SOLN
INTRAMUSCULAR | Status: AC
  Filled 2021-07-17: qty 1

## 2021-07-17 MED ORDER — LACTATED RINGERS IV SOLN: INTRAVENOUS | Status: DC

## 2021-07-17 MED ORDER — OXYCODONE HCL 5 MG PO TABS: 5.0000 mg | ORAL_TABLET | Freq: Once | ORAL | Status: DC | PRN

## 2021-07-17 MED ORDER — FENTANYL CITRATE (PF) 100 MCG/2ML IJ SOLN: 25.0000 ug | INTRAMUSCULAR | Status: DC | PRN

## 2021-07-17 SURGICAL SUPPLY — 26 items
COVER BACK TABLE 60X90IN (DRAPES) ×2 IMPLANT
COVER MAYO STAND STRL (DRAPES) ×2 IMPLANT
CUTTER ENDO LINEAR 45M (STAPLE) IMPLANT
CUTTER FLEX LINEAR 45M (STAPLE) ×1 IMPLANT
DRAPE HALF SHEET 40X57 (DRAPES) ×2 IMPLANT
GAUZE 4X4 16PLY ~~LOC~~+RFID DBL (SPONGE) ×2 IMPLANT
GLOVE SURG ENC MOIS LTX SZ7.5 (GLOVE) ×2 IMPLANT
GUARD TEETH (MISCELLANEOUS) ×1 IMPLANT
KIT BASIN OR (CUSTOM PROCEDURE TRAY) ×2 IMPLANT
KIT TURNOVER KIT B (KITS) ×2 IMPLANT
NS IRRIG 1000ML POUR BTL (IV SOLUTION) ×2 IMPLANT
PAD ARMBOARD 7.5X6 YLW CONV (MISCELLANEOUS) ×4 IMPLANT
POSITIONER HEAD DONUT 9IN (MISCELLANEOUS) ×2 IMPLANT
RELOAD STAPLE 45 3.5 BLU ETS (ENDOMECHANICALS) ×1 IMPLANT
RELOAD STAPLE TA45 3.5 REG BLU (ENDOMECHANICALS) ×4 IMPLANT
SOL ANTI FOG 6CC (MISCELLANEOUS) ×1 IMPLANT
SOLUTION ANTI FOG 6CC (MISCELLANEOUS) ×1
SPONGE NEURO XRAY DETECT 1X3 (DISPOSABLE) ×2 IMPLANT
SURGILUBE 2OZ TUBE FLIPTOP (MISCELLANEOUS) ×2 IMPLANT
SUT SILK 2 0 SH (SUTURE) IMPLANT
SUT VIC AB 2-0 UR6 27 (SUTURE) IMPLANT
SUT VIC AB 4-0 TF 27 (SUTURE) ×3 IMPLANT
TOWEL GREEN STERILE FF (TOWEL DISPOSABLE) ×4 IMPLANT
TUBE CONNECTING 12X1/4 (SUCTIONS) ×2 IMPLANT
TUBE SALEM SUMP 12R W/ARV (TUBING) IMPLANT
TUBE SALEM SUMP 14F W/ARV (TUBING) IMPLANT

## 2021-07-17 NOTE — Progress Notes (Signed)
ENT Post Operative Note ? ?Subjective: ?Patient seen and examined at bedside. Reports pain controlled. CLD ordered, but patient was given crackers in PACU tolerating PO without issue.  ? ?Vitals:  ? 07/17/21 1549 07/17/21 1619  ?BP: (!) 149/80 (!) 154/79  ?Pulse: (!) 59 (!) 55  ?Resp: 15 17  ?Temp:    ?SpO2: 99% 98%  ? ? ? ?OBJECTIVE  ?Gen: alert, cooperative, appropriate ?Head/ENT: EOMI, mucus membranes moist and pink, conjunctiva clear ?Neck soft. ?Respiratory: Voice without dysphonia. Non-labored breathing, no accessory muscle use, good O2 saturations on room air ?Neuro: CN II-XII grossly intact ? ?ASSESS/ PLAN ? ?Ryan Moreno is a 76 y.o. male who is POD 0 from Endoscopic Zenker's diverticulotomy. ? ?-Continue observation ?-CLEAR LIQUID DIET  ?-Encourage IS use, ambulation to tolerance with assist ?-Pain control ? ?Thank you for allowing me to participate in the care of this patient. Please do not hesitate to contact me with any questions or concerns.  ? ?[place], DO ?Otolaryngology ?[facility] ?Cell: [phone] ? ?

## 2021-07-17 NOTE — Anesthesia Preprocedure Evaluation (Signed)
Anesthesia Evaluation  ?Patient identified by MRN, date of birth, ID band ?Patient awake ? ? ? ?Reviewed: ?Allergy & Precautions, NPO status , Patient's Chart, lab work & pertinent test results ? ?History of Anesthesia Complications ?Negative for: history of anesthetic complications ? ?Airway ?Mallampati: II ? ?TM Distance: >3 FB ?Neck ROM: Full ? ? ? Dental ? ?(+) Dental Advisory Given, Teeth Intact ?  ?Pulmonary ?neg pulmonary ROS,  ?  ?breath sounds clear to auscultation ? ? ? ? ? ? Cardiovascular ?hypertension, Pt. on medications ?(-) angina(-) Past MI and (-) CHF  ?Rhythm:Regular  ? ?  ?Neuro/Psych ?negative neurological ROS ? negative psych ROS  ? GI/Hepatic ?Neg liver ROS, ZENKER'S DIVERTICULECTOMY ENDOSCOPIC ?  ?Endo/Other  ?negative endocrine ROS ? Renal/GU ?negative Renal ROS  ? ?  ?Musculoskeletal ? ?(+) Arthritis ,  ? Abdominal ?  ?Peds ? Hematology ?negative hematology ROS ?(+) Lab Results ?     Component                Value               Date                 ?     WBC                      7.8                 07/15/2021           ?     HGB                      13.0                07/15/2021           ?     HCT                      38.7 (L)            07/15/2021           ?     MCV                      100.3 (H)           07/15/2021           ?     PLT                      168                 07/15/2021           ?   ?Anesthesia Other Findings ? ? Reproductive/Obstetrics ? ?  ? ? ? ? ? ? ? ? ? ? ? ? ? ?  ?  ? ? ? ? ? ? ? ? ?Anesthesia Physical ?Anesthesia Plan ? ?ASA: 2 ? ?Anesthesia Plan: General  ? ?Post-op Pain Management: Ofirmev IV (intra-op)* and Toradol IV (intra-op)*  ? ?Induction: Intravenous, Rapid sequence and Cricoid pressure planned ? ?PONV Risk Score and Plan: 2 and Ondansetron and Dexamethasone ? ?Airway Management Planned: Oral ETT ? ?Additional Equipment: None ? ?Intra-op Plan:  ? ?Post-operative Plan: Extubation in OR ? ?Informed Consent: I have  reviewed the patients History and Physical, chart, labs and discussed the procedure including the risks, benefits and alternatives for  the proposed anesthesia with the patient or authorized representative who has indicated his/her understanding and acceptance.  ? ? ? ?Dental advisory given ? ?Plan Discussed with: CRNA and Anesthesiologist ? ?Anesthesia Plan Comments:   ? ? ? ? ? ? ?Anesthesia Quick Evaluation ? ?

## 2021-07-17 NOTE — Transfer of Care (Signed)
Immediate Anesthesia Transfer of Care Note ? ?Patient: Ryan Moreno ? ?Procedure(s) Performed: ZENKER'S DIVERTICULECTOMY ENDOSCOPIC (Mouth) ? ?Patient Location: PACU ? ?Anesthesia Type:General ? ?Level of Consciousness: awake, alert  and oriented ? ?Airway & Oxygen Therapy: Patient Spontanous Breathing and Patient connected to face mask oxygen ? ?Post-op Assessment: Report given to RN, Post -op Vital signs reviewed and stable and Patient moving all extremities X 4 ? ?Post vital signs: Reviewed and stable ? ?Last Vitals:  ?Vitals Value Taken Time  ?BP 146/75 07/17/21 1204  ?Temp    ?Pulse 86 07/17/21 1206  ?Resp 32 07/17/21 1205  ?SpO2 95 % 07/17/21 1206  ?Vitals shown include unvalidated device data. ? ?Last Pain:  ?Vitals:  ? 07/17/21 0910  ?TempSrc:   ?PainSc: 3   ?   ? ?Patients Stated Pain Goal: 0 (07/17/21 0910) ? ?Complications: No notable events documented. ?

## 2021-07-17 NOTE — Anesthesia Postprocedure Evaluation (Signed)
Anesthesia Post Note ? ?Patient: Ryan Moreno ? ?Procedure(s) Performed: ZENKER'S DIVERTICULECTOMY ENDOSCOPIC (Mouth) ? ?  ? ?Patient location during evaluation: PACU ?Anesthesia Type: General ?Level of consciousness: awake and alert, oriented and patient cooperative ?Pain management: pain level controlled ?Vital Signs Assessment: post-procedure vital signs reviewed and stable ?Respiratory status: spontaneous breathing, nonlabored ventilation and respiratory function stable ?Cardiovascular status: blood pressure returned to baseline and stable ?Postop Assessment: no apparent nausea or vomiting ?Anesthetic complications: no ? ? ?No notable events documented. ? ?Last Vitals:  ?Vitals:  ? 07/17/21 1304 07/17/21 1319  ?BP: 131/76 119/68  ?Pulse: 62 (!) 58  ?Resp: 13 18  ?Temp:    ?SpO2: 96% 95%  ?  ?Last Pain:  ?Vitals:  ? 07/17/21 1304  ?TempSrc:   ?PainSc: 0-No pain  ? ? ?  ?  ?  ?  ?  ?  ? ?Jarome Matin Dres[place] Lozito ? ? ? ? ?

## 2021-07-17 NOTE — Plan of Care (Signed)
  Problem: Education: Goal: Knowledge of General Education information will improve Description Including pain rating scale, medication(s)/side effects and non-pharmacologic comfort measures Outcome: Progressing   Problem: Health Behavior/Discharge Planning: Goal: Ability to manage health-related needs will improve Outcome: Progressing   

## 2021-07-17 NOTE — H&P (Signed)
Ryan Moreno is an 76 y.o. male.   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 ? ?Past Medical History:  ?Diagnosis Date  ? 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 Allergy   ? Arthritis   ? Ascending aortic aneurysm   ? Chronic cough   ? Hepatic steatosis   ? EtOH plus minus diet  ? Hx of colonic polyps 06/27/2016  ? 2004 polyps reported 2012 diminutive adenoma 06/27/2016 7 diminutive polyps removed ALL adenomas recall 2021   ? Hyperlipidemia   ? Hypertension   ? Transient disorientation   ? Zenker's (hypopharyngeal) diverticulum   ? ? ?Past Surgical History:  ?Procedure Laterality Date  ? COLONOSCOPY  2012, 06/27/2016  ? HERNIA REPAIR Left 1990  ? inguinal  ? TONSILLECTOMY    ? removed as a child  ? ? ?Family History  ?Problem Relation Age of Onset  ? Alcoholism Mother   ? Emphysema Father   ? Esophageal cancer Neg Hx   ? Rectal cancer Neg Hx   ? Stomach cancer Neg Hx   ? Colon polyps Neg Hx   ? Colon cancer Neg Hx   ? ?Social History:  reports that he has never smoked. He has never used smokeless tobacco. He reports that he does not currently use alcohol. He reports that he does not use drugs. ? ?Allergies:  ?Allergies  ?Allergen Reactions  ? Compazine [Prochlorperazine Edisylate] Anaphylaxis  ? Lisinopril Other (See Comments)  ?  "Brain fog"  ? ? ?Medications Prior to Admission  ?Medication Sig Dispense Refill  ? allopurinol (ZYLOPRIM) 100 MG tablet Take 100 mg by mouth daily.    ? Cyanocobalamin (B-12) 5000 MCG CAPS Take 5,000 mcg by mouth daily.    ? indomethacin (INDOCIN) 25 MG capsule Take 25 mg by mouth daily as needed for moderate pain.    ? losartan (COZAAR) 25 MG tablet Take 25 mg by mouth daily.    ? rosuvastatin (CRESTOR) 10 MG tablet Take 10 mg by mouth daily.    ? thiamine (VITAMIN B-1) 100 MG tablet Take 1 tablet (100 mg total) by mouth daily. (Patient not taking: Reported on 07/10/2021)    ? ? ?No results found for this or any previous visit  (from the past 48 hour(s)). ?No results found. ? ?Review of Systems  ?All other systems reviewed and are negative. ? ?Blood pressure (!) 185/64, pulse 62, temperature 97.7 ?F (36.5 ?C), temperature source Oral, resp. rate 18, height 5\' 11"  (1.803 m), weight 81.2 kg, SpO2 100 %. ?Physical Exam ?Constitutional:   ?   Appearance: Normal appearance. He is normal weight.  ?HENT:  ?   Head: Normocephalic and atraumatic.  ?   Right Ear: External ear normal.  ?   Left Ear: External ear normal.  ?   Nose: Nose normal.  ?   Mouth/Throat:  ?   Mouth: Mucous membranes are moist.  ?   Pharynx: Oropharynx is clear.  ?Eyes:  ?   Extraocular Movements: Extraocular movements intact.  ?   Conjunctiva/sclera: Conjunctivae normal.  ?   Pupils: Pupils are equal, round, and reactive to light.  ?Cardiovascular:  ?   Rate and Rhythm: Normal rate.  ?Pulmonary:  ?   Effort: Pulmonary effort is normal.  ?Musculoskeletal:  ?   Cervical back: Normal range of motion.  ?Skin: ?   General: Skin is warm and dry.  ?Neurological:  ?   General: No focal deficit  present.  ?   Mental Status: He is alert and oriented to person, place, and time.  ?Psychiatric:     ?   Mood and Affect: Mood normal.     ?   Behavior: Behavior normal.     ?   Thought Content: Thought content normal.     ?   Judgment: Judgment normal.  ?  ? ?Assessment/Plan ?Zenker's diverticulum and dysphagia ? ?To OR for endoscopic Zenker's diverticulotomy. ? ?Melida Quitter, MD ?07/17/2021, 10:35 AM ? ? ? ?

## 2021-07-17 NOTE — Op Note (Signed)
PREOPERATIVE DIAGNOSIS:  Zenker's diverticulum and pharyngoesophageal dysphagia ?  ?POSTOPERATIVE DIAGNOSIS:  Zenker's diverticulum and pharyngoesophageal dysphagia ?  ?PROCEDURE:  Endoscopic Zenker's diverticulotomy ?  ?SURGEON:  Melida Quitter, MD ?  ?ANESTHESIA:  General endotracheal anesthesia ?  ?COMPLICATIONS:  None. ?  ?INDICATIONS:  The patient is a 76 year old male with a history of worsening dysphagia due to a Zenker's diverticulum demonstrated on barium swallow.  He presents to the operating room for surgical management. ?  ?FINDINGS:  Zenker's diverticulum in typical location ?  ?DESCRIPTION OF PROCEDURE:  The patient was identified in the holding room, informed consent having been obtained including discussion of risks, benefits and alternatives, the patient was brought to the operative suite and put the operative table in  ?supine position.  Anesthesia was induced and the patient was maintained via mask ventilation.  The eyes were taped closed and bed was turned 90 degrees from anesthesia.  The patient was given intravenous steroids during the ? case.  A tooth guard was placed over the upper teeth and a Weerda bi-valved pharyngoscope was inserted and passed into the upper esophagus.  No debris was encountered in the Zenker's diverticulum.  With difficulty identifying the common wall of the diverticulum and esophagus, a cervical esophagoscope was inserted and used to easily identify the wall.  The Weerda scope was reinserted and manipulated until the common wall was isolated in view.  The scope was suspended to the Mayo stand using a Lewy arm.  A 4-0 Vicryl was then thrown through the common wall to either side and used for superior retraction.  A preoperative photo was made with the zero degree telescope.  The Endopath ETS flex 45 stapler was then inserted and closed and engaged on the common wall and then removed.  A second discharge of the staple gun was then performed to extend the incision a bit  further.  After completion, a postoperative photo was made.  The Weerda scope was then taken out of suspension and removed from the patient's mouth.  The tooth guard was removed.  He was turned back to anesthesia for wake-up and was extubated and moved to the recovery room in stable condition. ? ?

## 2021-07-17 NOTE — Anesthesia Procedure Notes (Signed)
Procedure Name: Intubation ?Date/Time: 07/17/2021 11:02 AM ?Performed by: Genelle Bal, CRNA ?Pre-anesthesia Checklist: Patient identified, Emergency Drugs available, Suction available and Patient being monitored ?Patient Re-evaluated:Patient Re-evaluated prior to induction ?Oxygen Delivery Method: Circle system utilized ?Preoxygenation: Pre-oxygenation with 100% oxygen ?Induction Type: IV induction ?Ventilation: Mask ventilation without difficulty ?Laryngoscope Size: Sabra Heck and 2 ?Grade View: Grade I ?Tube type: Oral ?Tube size: 6.5 mm ?Number of attempts: 1 ?Airway Equipment and Method: Stylet and Oral airway ?Placement Confirmation: ETT inserted through vocal cords under direct vision, positive ETCO2 and breath sounds checked- equal and bilateral ?Secured at: 23 cm ?Tube secured with: Tape ?Dental Injury: Teeth and Oropharynx as per pre-operative assessment  ? ? ? ? ?

## 2021-07-17 NOTE — Brief Op Note (Signed)
07/17/2021 ? ?11:46 AM ? ?PATIENT:  Ryan Moreno  76 y.o. male ? ?PRE-OPERATIVE DIAGNOSIS:  Zenker's diverticulum ? ?POST-OPERATIVE DIAGNOSIS:  Zenker's diverticulum ? ?PROCEDURE:  Procedure(s): ?ZENKER'S DIVERTICULECTOMY ENDOSCOPIC (N/A) ? ?SURGEON:  Surgeon(s) and Role: ?   Melida Quitter, MD - Primary ? ?PHYSICIAN ASSISTANT:  ? ?ASSISTANTS: none  ? ?ANESTHESIA:   general ? ?EBL:  None  ? ?BLOOD ADMINISTERED:none ? ?DRAINS: none  ? ?LOCAL MEDICATIONS USED:  NONE ? ?SPECIMEN:  No Specimen ? ?DISPOSITION OF SPECIMEN:  N/A ? ?COUNTS:  YES ? ?TOURNIQUET:  * No tourniquets in log * ? ?DICTATION: .Note written in EPIC ? ?PLAN OF CARE: Admit for overnight observation ? ?PATIENT DISPOSITION:  PACU - hemodynamically stable. ?  ?Delay start of Pharmacological VTE agent (>24hrs) due to surgical blood loss or risk of bleeding: no ? ?

## 2021-07-18 ENCOUNTER — Encounter (HOSPITAL_COMMUNITY): Payer: Self-pay | Admitting: Otolaryngology

## 2021-07-18 ENCOUNTER — Other Ambulatory Visit: Payer: Medicare Other

## 2021-07-18 DIAGNOSIS — K225 Diverticulum of esophagus, acquired: Secondary | ICD-10-CM | POA: Diagnosis not present

## 2021-07-18 DIAGNOSIS — Z79899 Other long term (current) drug therapy: Secondary | ICD-10-CM | POA: Diagnosis not present

## 2021-07-18 DIAGNOSIS — I10 Essential (primary) hypertension: Secondary | ICD-10-CM | POA: Diagnosis not present

## 2021-07-18 NOTE — Plan of Care (Signed)
?  Problem: Education: ?Goal: Knowledge of General Education information will improve ?Description: Including pain rating scale, medication(s)/side effects and non-pharmacologic comfort measures ?Outcome: Progressing ?  ?Problem: Health Behavior/Discharge Planning: ?Goal: Ability to manage health-related needs will improve ?Outcome: Progressing ?  ?Problem: Clinical Measurements: ?Goal: Ability to maintain clinical measurements within normal limits will improve ?Outcome: Progressing ?Goal: Will remain free from infection ?Outcome: Progressing ?Goal: Diagnostic test results will improve ?Outcome: Progressing ?Goal: Respiratory complications will improve ?Outcome: Progressing ?Goal: Cardiovascular complication will be avoided ?Outcome: Progressing ?  ?Problem: Activity: ?Goal: Risk for activity intolerance will decrease ?Outcome: Progressing ?  ?Problem: Nutrition: ?Goal: Adequate nutrition will be maintained ?Outcome: Progressing ?  ?Problem: Elimination: ?Goal: Will not experience complications related to bowel motility ?Outcome: Progressing ?Goal: Will not experience complications related to urinary retention ?Outcome: Progressing ?  ?Problem: Coping: ?Goal: Level of anxiety will decrease ?Outcome: Progressing ?  ?

## 2021-07-18 NOTE — Discharge Summary (Signed)
Physician Discharge Summary  ?Patient ID: ?Ryan Moreno ?MRN: [id number] ?DOB/AGE: [birth year] 76 y.o. ? ?Admit date: 07/17/2021 ?Discharge date: 07/18/2021 ? ?Admission Diagnoses: Zenker's diverticulum, dysphagia ? ?Discharge Diagnoses:  ?Principal Problem: ?  Zenker's diverticulum ?Dysphagia ? ?Discharged Condition: good ? ?Hospital Course: 76 year old male with dysphagia and cough related to a Zenker's diverticulum, presented for endoscopic management.  See operative note.  He was observed overnight and did well.  He is felt stable for discharge on POD 1. ? ?Consults: None ? ?Significant Diagnostic Studies: None ? ?Treatments: surgery: Endoscopic Zenker's diverticulotomy ? ?Discharge Exam: ?Blood pressure 126/68, pulse (!) 58, temperature 98.4 ?F (36.9 ?C), temperature source Oral, resp. rate 18, height 5\' 11"  (1.803 m), weight 81.2 kg, SpO2 98 %. ?General appearance: alert, cooperative, and no distress ? ?Disposition: Discharge disposition: 01-Home or Self Care ? ? ? ? ? ? ?Discharge Instructions   ? ? Diet - low sodium heart healthy   Complete by: As directed ?  ? Discharge instructions   Complete by: As directed ?  ? Advance diet slowly as instructed.  ? Increase activity slowly   Complete by: As directed ?  ? No wound care   Complete by: As directed ?  ? ?  ? ?Allergies as of 07/18/2021   ? ?   Reactions  ? Compazine [prochlorperazine Edisylate] Anaphylaxis  ? Lisinopril Other (See Comments)  ? "Brain fog"  ? ?  ? ?  ?Medication List  ?  ? ?TAKE these medications   ? ?allopurinol 100 MG tablet ?Commonly known as: ZYLOPRIM ?Take 100 mg by mouth daily. ?  ?B-12 5000 MCG Caps ?Take 5,000 mcg by mouth daily. ?  ?indomethacin 25 MG capsule ?Commonly known as: INDOCIN ?Take 25 mg by mouth daily as needed for moderate pain. ?  ?losartan 25 MG tablet ?Commonly known as: COZAAR ?Take 25 mg by mouth daily. ?  ?rosuvastatin 10 MG tablet ?Commonly known as: CRESTOR ?Take 10 mg by mouth daily. ?  ?thiamine 100 MG  tablet ?Commonly known as: VITAMIN B-1 ?Take 1 tablet (100 mg total) by mouth daily. ?  ? ?  ? ? Follow-up Information   ? ? Melida Quitter, MD. Schedule an appointment as soon as possible for a visit in 2 week(s).   ?Specialty: Otolaryngology ?Contact information: ?[address] ?[address] ?[place] [address] ?[phone] ? ? ?  ?  ? ?  ?  ? ?  ? ? ?Signed: ?Melida Quitter ?07/18/2021, 8:21 AM ? ? ?

## 2021-07-23 DIAGNOSIS — L570 Actinic keratosis: Secondary | ICD-10-CM | POA: Diagnosis not present

## 2021-07-23 DIAGNOSIS — C44629 Squamous cell carcinoma of skin of left upper limb, including shoulder: Secondary | ICD-10-CM | POA: Diagnosis not present

## 2021-07-23 DIAGNOSIS — D1801 Hemangioma of skin and subcutaneous tissue: Secondary | ICD-10-CM | POA: Diagnosis not present

## 2021-07-23 DIAGNOSIS — L814 Other melanin hyperpigmentation: Secondary | ICD-10-CM | POA: Diagnosis not present

## 2021-07-23 DIAGNOSIS — Z08 Encounter for follow-up examination after completed treatment for malignant neoplasm: Secondary | ICD-10-CM | POA: Diagnosis not present

## 2021-07-23 DIAGNOSIS — Z85828 Personal history of other malignant neoplasm of skin: Secondary | ICD-10-CM | POA: Diagnosis not present

## 2021-07-23 DIAGNOSIS — L821 Other seborrheic keratosis: Secondary | ICD-10-CM | POA: Diagnosis not present

## 2021-07-31 ENCOUNTER — Ambulatory Visit: Admit: 2021-07-31 | Payer: Medicare Other | Admitting: Otolaryngology

## 2021-07-31 SURGERY — ZENKER'S DIVERTICULECTOMY ENDOSCOPIC
Anesthesia: General

## 2021-12-04 DIAGNOSIS — H2513 Age-related nuclear cataract, bilateral: Secondary | ICD-10-CM | POA: Diagnosis not present

## 2022-01-23 DIAGNOSIS — L821 Other seborrheic keratosis: Secondary | ICD-10-CM | POA: Diagnosis not present

## 2022-01-23 DIAGNOSIS — D225 Melanocytic nevi of trunk: Secondary | ICD-10-CM | POA: Diagnosis not present

## 2022-01-23 DIAGNOSIS — Z08 Encounter for follow-up examination after completed treatment for malignant neoplasm: Secondary | ICD-10-CM | POA: Diagnosis not present

## 2022-01-23 DIAGNOSIS — Z85828 Personal history of other malignant neoplasm of skin: Secondary | ICD-10-CM | POA: Diagnosis not present

## 2022-01-23 DIAGNOSIS — L814 Other melanin hyperpigmentation: Secondary | ICD-10-CM | POA: Diagnosis not present

## 2022-04-30 DIAGNOSIS — I10 Essential (primary) hypertension: Secondary | ICD-10-CM | POA: Diagnosis not present

## 2022-04-30 DIAGNOSIS — D7589 Other specified diseases of blood and blood-forming organs: Secondary | ICD-10-CM | POA: Diagnosis not present

## 2022-04-30 DIAGNOSIS — Z681 Body mass index (BMI) 19 or less, adult: Secondary | ICD-10-CM | POA: Diagnosis not present

## 2022-04-30 DIAGNOSIS — K219 Gastro-esophageal reflux disease without esophagitis: Secondary | ICD-10-CM | POA: Diagnosis not present

## 2022-04-30 DIAGNOSIS — Z7689 Persons encountering health services in other specified circumstances: Secondary | ICD-10-CM | POA: Diagnosis not present

## 2022-04-30 DIAGNOSIS — Z0000 Encounter for general adult medical examination without abnormal findings: Secondary | ICD-10-CM | POA: Diagnosis not present

## 2022-04-30 DIAGNOSIS — I714 Abdominal aortic aneurysm, without rupture, unspecified: Secondary | ICD-10-CM | POA: Diagnosis not present

## 2022-04-30 DIAGNOSIS — E7800 Pure hypercholesterolemia, unspecified: Secondary | ICD-10-CM | POA: Diagnosis not present

## 2022-04-30 DIAGNOSIS — I700 Atherosclerosis of aorta: Secondary | ICD-10-CM | POA: Diagnosis not present

## 2022-04-30 DIAGNOSIS — M1A9XX0 Chronic gout, unspecified, without tophus (tophi): Secondary | ICD-10-CM | POA: Diagnosis not present

## 2022-04-30 DIAGNOSIS — R7301 Impaired fasting glucose: Secondary | ICD-10-CM | POA: Diagnosis not present

## 2022-05-15 DIAGNOSIS — L298 Other pruritus: Secondary | ICD-10-CM | POA: Diagnosis not present

## 2022-05-15 DIAGNOSIS — Z789 Other specified health status: Secondary | ICD-10-CM | POA: Diagnosis not present

## 2022-05-15 DIAGNOSIS — L820 Inflamed seborrheic keratosis: Secondary | ICD-10-CM | POA: Diagnosis not present

## 2022-05-15 DIAGNOSIS — L538 Other specified erythematous conditions: Secondary | ICD-10-CM | POA: Diagnosis not present

## 2022-07-09 DIAGNOSIS — I700 Atherosclerosis of aorta: Secondary | ICD-10-CM | POA: Diagnosis not present

## 2022-07-09 DIAGNOSIS — R059 Cough, unspecified: Secondary | ICD-10-CM | POA: Diagnosis not present

## 2022-07-09 DIAGNOSIS — R051 Acute cough: Secondary | ICD-10-CM | POA: Diagnosis not present

## 2022-07-21 DIAGNOSIS — I700 Atherosclerosis of aorta: Secondary | ICD-10-CM | POA: Diagnosis not present

## 2022-07-21 DIAGNOSIS — Z713 Dietary counseling and surveillance: Secondary | ICD-10-CM | POA: Diagnosis not present

## 2022-09-01 DIAGNOSIS — Z713 Dietary counseling and surveillance: Secondary | ICD-10-CM | POA: Diagnosis not present

## 2022-10-16 DIAGNOSIS — Z713 Dietary counseling and surveillance: Secondary | ICD-10-CM | POA: Diagnosis not present

## 2022-12-05 DIAGNOSIS — H2513 Age-related nuclear cataract, bilateral: Secondary | ICD-10-CM | POA: Diagnosis not present

## 2023-01-05 DIAGNOSIS — H25813 Combined forms of age-related cataract, bilateral: Secondary | ICD-10-CM | POA: Diagnosis not present

## 2023-01-06 IMAGING — MR MR ABDOMEN WO/W CM
29 of 30 series · 47 of 48 positions shown · IV contrast (gadavist)
Comparison: Chest CT July 09, 2020 and abdominal ultrasound May

CLINICAL DATA: Further evaluation of renal mass seen on prior
ultrasound.

EXAM:
MRI ABDOMEN WITHOUT AND WITH CONTRAST
TECHNIQUE: Multiplanar multisequence MR imaging of the abdomen was performed
both before and after the administration of intravenous contrast.
CONTRAST:  9mL GADAVIST GADOBUTROL 1 MMOL/ML IV SOLN

[Series 3: T2 · coronal · 6.0mm · 1.56mm/px · 1 of 30 slices shown (1 of 2)]
[im 1/30]
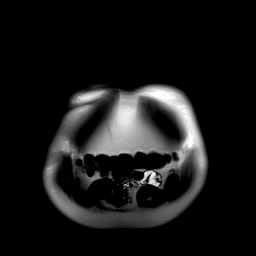

[Series 4: T2 fat-sat · 1 of 6 slices shown (1 of 2)]
[im 1/6]
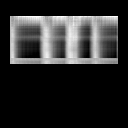

[Series 5: T2 fat-sat · axial · 6.0mm · 1.19mm/px · 1 of 36 slices shown (2 of 2)]
[im 1/36]
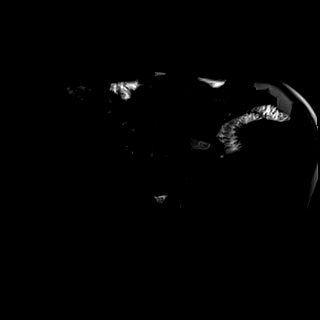

[Series 6: T1 · axial · 3.0mm · 1.19mm/px · 1 of 72 slices shown (1 of 2)]
[im 1/72]
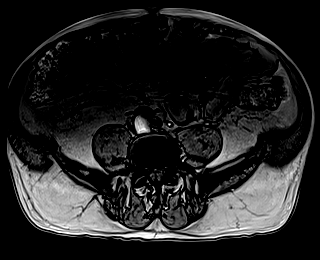

[Series 7: T1 · axial · 3.0mm · 1.19mm/px · 1 of 72 slices shown (2 of 2)]
[im 1/72]
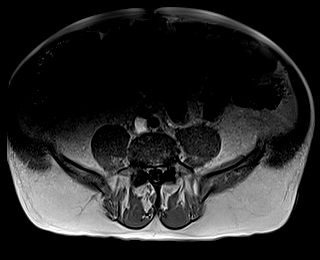

[Series 8: DWI · axial · 6.0mm · 1.42mm/px · 1 of 72 slices shown (1 of 2)]
[im 1/72]
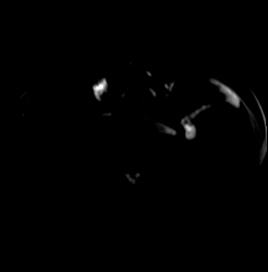

[Series 9: DWI · axial · 6.0mm · 1.42mm/px · 1 of 36 slices shown (2 of 2)]
[im 1/36]
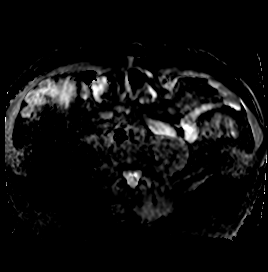

[Series 10: bSSFP · axial · 5.0mm · 0.78mm/px · 1 of 40 slices shown]
[im 1/40]
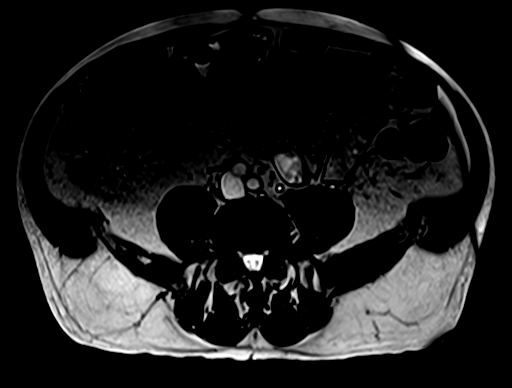

[Series 11: T1 dynamic · axial · 3.0mm · 1.19mm/px · 1 of 80 slices shown (1 of 20)]
[im 1/80]
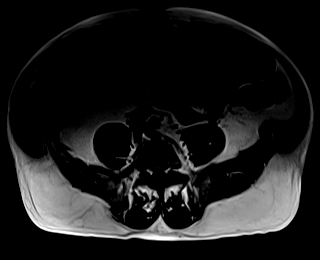

[Series 12: T1 dynamic · axial · 3.0mm · 1.19mm/px · 1 of 80 slices shown (2 of 20)]
[im 1/80]
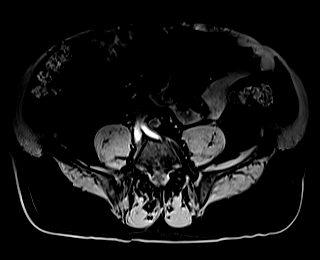

[Series 14: T1 dynamic · axial · 3.0mm · 1.19mm/px · z∈[-154,+83]mm · 2 of 80 slices shown (3 of 20)]
[im 1/80]
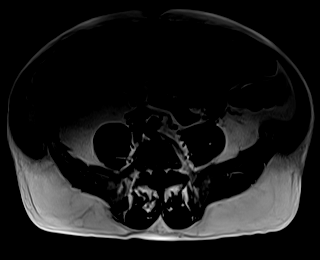
[im 80/80]
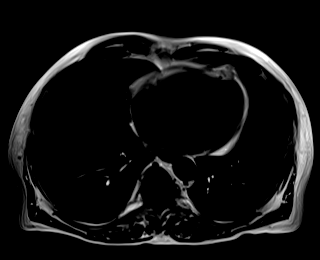

[Series 15: T1 dynamic · axial · 3.0mm · 1.19mm/px · z∈[-154,+83]mm · 2 of 79 slices shown (4 of 20)]
[im 1/79]
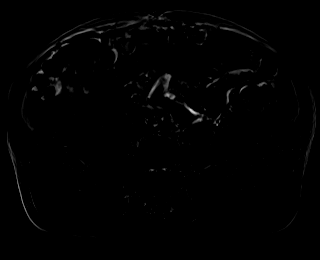
[im 79/79]
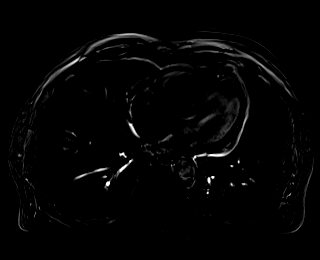

[Series 16: T1 dynamic · axial · 3.0mm · 1.19mm/px · z∈[-154,+83]mm · 2 of 80 slices shown (5 of 20)]
[im 1/80]
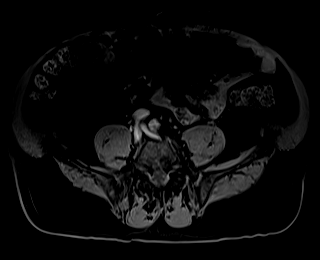
[im 80/80]
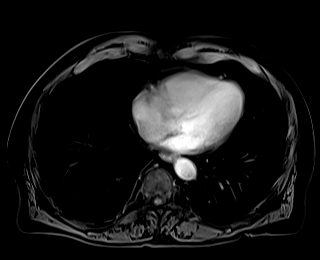

[Series 17: T1 dynamic · axial · 3.0mm · 1.19mm/px · z∈[-154,+83]mm · 2 of 80 slices shown (6 of 20)]
[im 1/80]
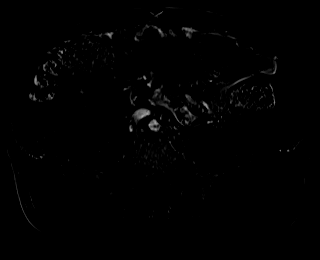
[im 80/80]
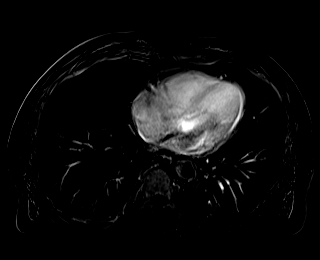

[Series 18: T1 dynamic · axial · 3.0mm · 1.19mm/px · z∈[-154,+83]mm · 2 of 80 slices shown (7 of 20)]
[im 1/80]
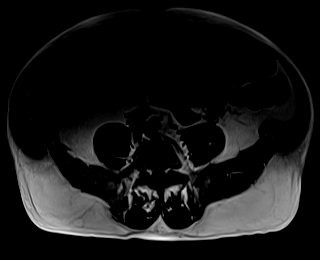
[im 80/80]
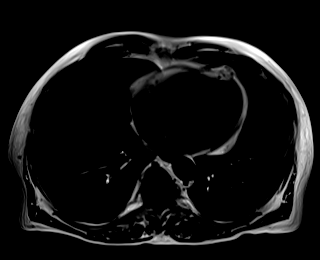

[Series 19: T1 dynamic · axial · 3.0mm · 1.19mm/px · z∈[-154,+83]mm · 2 of 80 slices shown (8 of 20)]
[im 1/80]
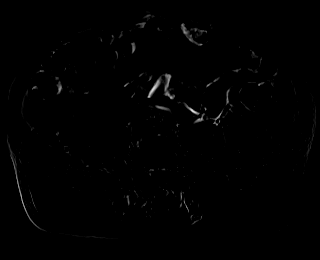
[im 80/80]
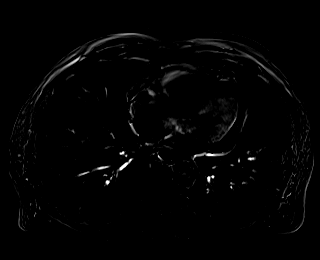

[Series 20: T1 dynamic · axial · 3.0mm · 1.19mm/px · z∈[-154,+83]mm · 2 of 80 slices shown (9 of 20)]
[im 1/80]
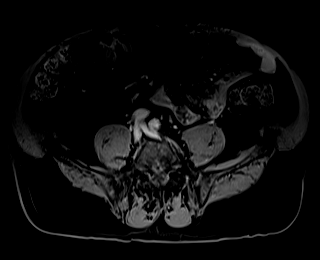
[im 80/80]
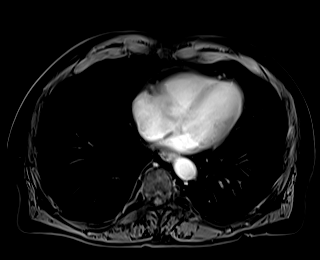

[Series 21: T1 dynamic · axial · 3.0mm · 1.19mm/px · z∈[-154,+83]mm · 2 of 80 slices shown (10 of 20)]
[im 1/80]
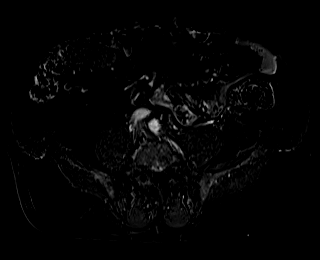
[im 80/80]
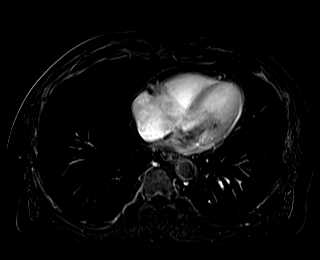

[Series 22: T1 dynamic · axial · 3.0mm · 1.19mm/px · z∈[-154,+83]mm · 2 of 80 slices shown (11 of 20)]
[im 1/80]
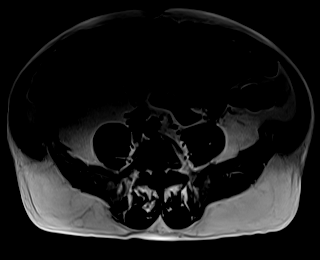
[im 80/80]
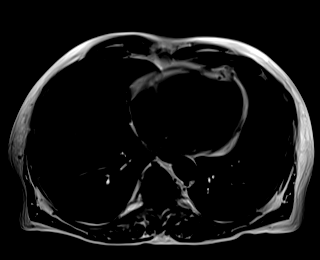

[Series 23: T1 dynamic · axial · 3.0mm · 1.19mm/px · z∈[-154,+83]mm · 2 of 79 slices shown (12 of 20)]
[im 1/79]
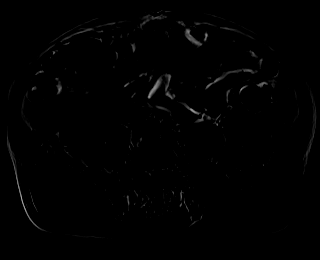
[im 79/79]
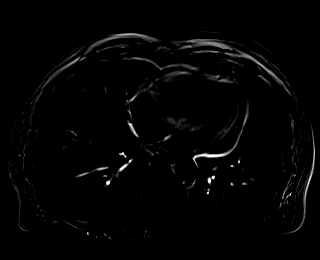

[Series 24: T1 dynamic · axial · 3.0mm · 1.19mm/px · z∈[-154,+83]mm · 2 of 80 slices shown (13 of 20)]
[im 1/80]
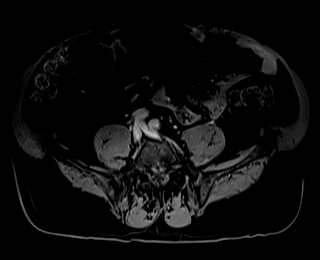
[im 80/80]
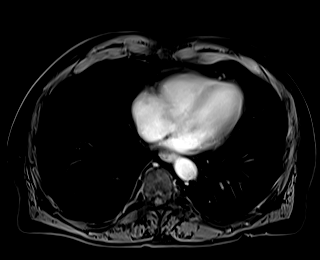

[Series 25: T1 dynamic · axial · 3.0mm · 1.19mm/px · z∈[-154,+83]mm · 2 of 80 slices shown (14 of 20)]
[im 1/80]
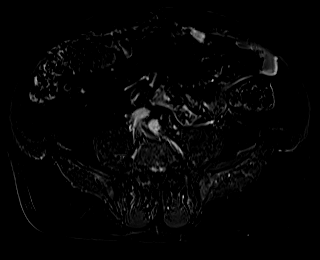
[im 80/80]
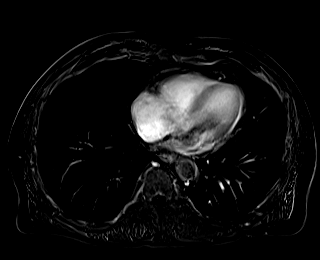

[Series 26: T1 dynamic · coronal · 3.0mm · 1.41mm/px · 2 of 72 slices shown (15 of 20)]
[im 1/72]
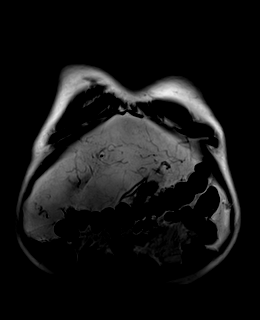
[im 72/72]
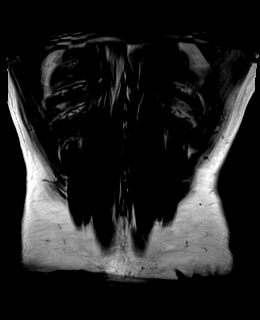

[Series 27: T1 dynamic · coronal · 3.0mm · 1.41mm/px · 2 of 72 slices shown (16 of 20)]
[im 1/72]
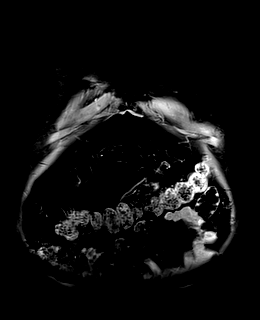
[im 72/72]
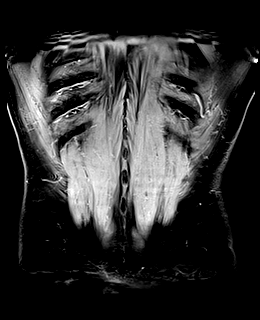

[Series 28: T2 · axial · 6.0mm · 1.48mm/px · 1 of 30 slices shown (2 of 2)]
[im 1/30]
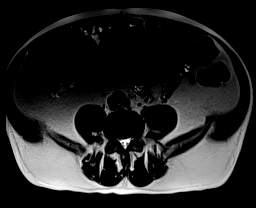

[Series 29: T1 dynamic · axial · 3.0mm · 1.19mm/px · z∈[-154,+83]mm · 2 of 80 slices shown (17 of 20)]
[im 1/80]
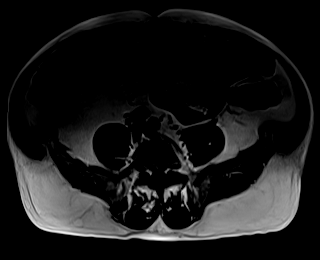
[im 80/80]
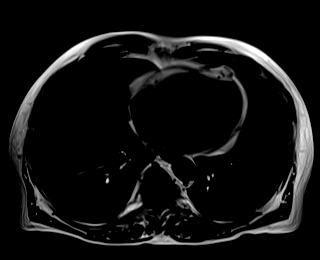

[Series 30: T1 dynamic · axial · 3.0mm · 1.19mm/px · z∈[-154,+83]mm · 2 of 80 slices shown (18 of 20)]
[im 1/80]
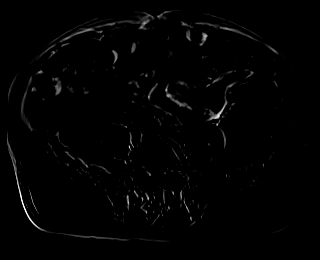
[im 80/80]
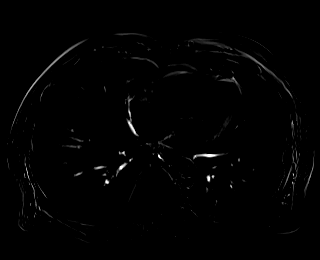

[Series 31: T1 dynamic · axial · 3.0mm · 1.19mm/px · z∈[-154,+83]mm · 2 of 80 slices shown (19 of 20)]
[im 1/80]
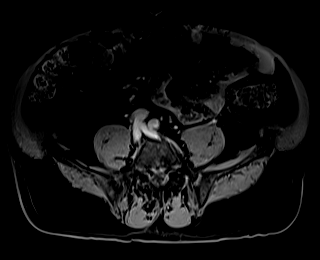
[im 80/80]
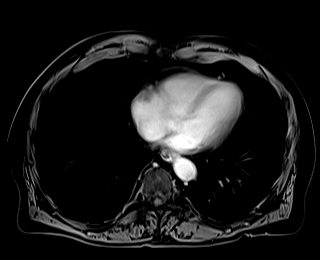

[Series 32: T1 dynamic · axial · 3.0mm · 1.19mm/px · z∈[-154,+83]mm · 2 of 80 slices shown (20 of 20)]
[im 1/80]
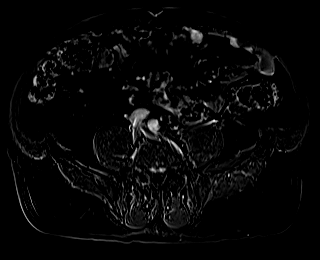
[im 80/80]
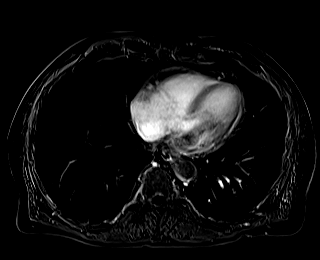

[47 of 48 positions shown; findings below may reference images not displayed]

FINDINGS: Lower chest: No acute abnormality.

Hepatobiliary: Mild loss of signal on out of phase imaging sequence
consistent with mild hepatic steatosis. Subtle contour nodularity is
again identified. No significant fissural widening and no
discernible enlargement of the caudate or lateral left lobe of the
liver. No arterially enhancing hepatic lesions. Gallbladder is
unremarkable. No biliary ductal dilation.

Pancreas: Normal intrinsic T1 signal of the pancreatic parenchyma.
No cystic or arterially enhancing pancreatic lesions. No pancreatic
ductal dilation. No pancreatic divisum.

Spleen:  Within normal limits in size and appearance.

Adrenals/Urinary Tract: Bilateral adrenal glands are unremarkable.
No hydronephrosis. No solid enhancing renal lesions.

Stomach/Bowel: Visualized portions within the abdomen are
unremarkable.

Vascular/Lymphatic: No pathologically enlarged lymph nodes
identified. The portal, hepatic, splenic and superior mesenteric
veins are patent. No abdominal aortic aneurysm demonstrated.

Other:  No abdominopelvic ascites.

Musculoskeletal: No suspicious bone lesions identified.
IMPRESSION: 1. No solid enhancing renal lesions, specifically no lesion
visualized in the left kidney to correspond with findings on prior
ultrasound.
2. Mild hepatic steatosis.
3. Subtle contour nodularity again seen without significant fissural
widening, discernible enlargement of the caudate/lateral left lobe
of the liver, or findings of portal hypertension. Nonspecific,
correlate with any clinical or laboratory evidence of liver disease.

## 2023-04-28 DIAGNOSIS — D485 Neoplasm of uncertain behavior of skin: Secondary | ICD-10-CM | POA: Diagnosis not present

## 2023-04-28 DIAGNOSIS — L821 Other seborrheic keratosis: Secondary | ICD-10-CM | POA: Diagnosis not present

## 2023-04-28 DIAGNOSIS — D0462 Carcinoma in situ of skin of left upper limb, including shoulder: Secondary | ICD-10-CM | POA: Diagnosis not present

## 2023-04-28 DIAGNOSIS — Z85828 Personal history of other malignant neoplasm of skin: Secondary | ICD-10-CM | POA: Diagnosis not present

## 2023-04-28 DIAGNOSIS — D229 Melanocytic nevi, unspecified: Secondary | ICD-10-CM | POA: Diagnosis not present

## 2023-04-28 DIAGNOSIS — L814 Other melanin hyperpigmentation: Secondary | ICD-10-CM | POA: Diagnosis not present

## 2023-04-28 DIAGNOSIS — Z08 Encounter for follow-up examination after completed treatment for malignant neoplasm: Secondary | ICD-10-CM | POA: Diagnosis not present

## 2023-05-12 DIAGNOSIS — K7460 Unspecified cirrhosis of liver: Secondary | ICD-10-CM | POA: Diagnosis not present

## 2023-05-12 DIAGNOSIS — E7800 Pure hypercholesterolemia, unspecified: Secondary | ICD-10-CM | POA: Diagnosis not present

## 2023-05-12 DIAGNOSIS — K219 Gastro-esophageal reflux disease without esophagitis: Secondary | ICD-10-CM | POA: Diagnosis not present

## 2023-05-12 DIAGNOSIS — I700 Atherosclerosis of aorta: Secondary | ICD-10-CM | POA: Diagnosis not present

## 2023-05-12 DIAGNOSIS — M1A9XX0 Chronic gout, unspecified, without tophus (tophi): Secondary | ICD-10-CM | POA: Diagnosis not present

## 2023-05-12 DIAGNOSIS — Z0000 Encounter for general adult medical examination without abnormal findings: Secondary | ICD-10-CM | POA: Diagnosis not present

## 2023-05-12 DIAGNOSIS — M542 Cervicalgia: Secondary | ICD-10-CM | POA: Diagnosis not present

## 2023-05-12 DIAGNOSIS — R7301 Impaired fasting glucose: Secondary | ICD-10-CM | POA: Diagnosis not present

## 2023-05-12 DIAGNOSIS — I10 Essential (primary) hypertension: Secondary | ICD-10-CM | POA: Diagnosis not present

## 2023-05-12 DIAGNOSIS — J479 Bronchiectasis, uncomplicated: Secondary | ICD-10-CM | POA: Diagnosis not present

## 2023-05-26 DIAGNOSIS — H25813 Combined forms of age-related cataract, bilateral: Secondary | ICD-10-CM | POA: Diagnosis not present

## 2023-08-14 DIAGNOSIS — H2513 Age-related nuclear cataract, bilateral: Secondary | ICD-10-CM | POA: Diagnosis not present

## 2023-08-14 DIAGNOSIS — H25043 Posterior subcapsular polar age-related cataract, bilateral: Secondary | ICD-10-CM | POA: Diagnosis not present

## 2023-08-14 DIAGNOSIS — H18413 Arcus senilis, bilateral: Secondary | ICD-10-CM | POA: Diagnosis not present

## 2023-08-14 DIAGNOSIS — H2511 Age-related nuclear cataract, right eye: Secondary | ICD-10-CM | POA: Diagnosis not present

## 2023-08-14 DIAGNOSIS — H25013 Cortical age-related cataract, bilateral: Secondary | ICD-10-CM | POA: Diagnosis not present

## 2023-09-30 DIAGNOSIS — H2511 Age-related nuclear cataract, right eye: Secondary | ICD-10-CM | POA: Diagnosis not present

## 2023-09-30 DIAGNOSIS — H2512 Age-related nuclear cataract, left eye: Secondary | ICD-10-CM | POA: Diagnosis not present

## 2023-10-07 DIAGNOSIS — H2511 Age-related nuclear cataract, right eye: Secondary | ICD-10-CM | POA: Diagnosis not present

## 2023-10-23 DIAGNOSIS — H25042 Posterior subcapsular polar age-related cataract, left eye: Secondary | ICD-10-CM | POA: Diagnosis not present

## 2023-10-23 DIAGNOSIS — H25012 Cortical age-related cataract, left eye: Secondary | ICD-10-CM | POA: Diagnosis not present

## 2023-10-23 DIAGNOSIS — H2512 Age-related nuclear cataract, left eye: Secondary | ICD-10-CM | POA: Diagnosis not present

## 2023-10-30 DIAGNOSIS — H2512 Age-related nuclear cataract, left eye: Secondary | ICD-10-CM | POA: Diagnosis not present

## 2023-10-30 DIAGNOSIS — H52223 Regular astigmatism, bilateral: Secondary | ICD-10-CM | POA: Diagnosis not present

## 2023-11-06 DIAGNOSIS — E7800 Pure hypercholesterolemia, unspecified: Secondary | ICD-10-CM | POA: Diagnosis not present

## 2023-11-06 DIAGNOSIS — M1A9XX0 Chronic gout, unspecified, without tophus (tophi): Secondary | ICD-10-CM | POA: Diagnosis not present

## 2023-11-06 DIAGNOSIS — I10 Essential (primary) hypertension: Secondary | ICD-10-CM | POA: Diagnosis not present

## 2023-11-06 DIAGNOSIS — R7303 Prediabetes: Secondary | ICD-10-CM | POA: Diagnosis not present
